# Patient Record
Sex: Male | Born: 2000 | Race: White | Hispanic: No | Marital: Single | Smoking: Never smoker
Health system: Southern US, Community
[De-identification: ages and names within clinical notes are randomized; demographics above are authoritative.]

## PROBLEM LIST (undated history)

## (undated) DIAGNOSIS — F913 Oppositional defiant disorder: Secondary | ICD-10-CM

## (undated) DIAGNOSIS — R010 Benign and innocent cardiac murmurs: Secondary | ICD-10-CM

## (undated) DIAGNOSIS — Z973 Presence of spectacles and contact lenses: Secondary | ICD-10-CM

## (undated) DIAGNOSIS — F39 Unspecified mood [affective] disorder: Secondary | ICD-10-CM

## (undated) DIAGNOSIS — J302 Other seasonal allergic rhinitis: Secondary | ICD-10-CM

## (undated) DIAGNOSIS — F909 Attention-deficit hyperactivity disorder, unspecified type: Secondary | ICD-10-CM

## (undated) DIAGNOSIS — G4700 Insomnia, unspecified: Secondary | ICD-10-CM

## (undated) HISTORY — DX: Other seasonal allergic rhinitis: J30.2

## (undated) HISTORY — DX: Oppositional defiant disorder: F91.3

## (undated) HISTORY — DX: Insomnia, unspecified: G47.00

## (undated) HISTORY — DX: Attention-deficit hyperactivity disorder, unspecified type: F90.9

## (undated) HISTORY — DX: Unspecified mood (affective) disorder: F39

## (undated) HISTORY — DX: Presence of spectacles and contact lenses: Z97.3

## (undated) HISTORY — PX: OTHER SURGICAL HISTORY: SHX169

---
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
## 2020-04-01 ENCOUNTER — Encounter (HOSPITAL_COMMUNITY): Payer: Self-pay | Admitting: Emergency Medicine

## 2020-04-01 ENCOUNTER — Other Ambulatory Visit: Payer: Self-pay

## 2020-04-01 ENCOUNTER — Emergency Department (HOSPITAL_COMMUNITY): Payer: BC Managed Care – PPO

## 2020-04-01 DIAGNOSIS — W298XXA Contact with other powered powered hand tools and household machinery, initial encounter: Secondary | ICD-10-CM | POA: Diagnosis not present

## 2020-04-01 DIAGNOSIS — Z79899 Other long term (current) drug therapy: Secondary | ICD-10-CM | POA: Diagnosis not present

## 2020-04-01 DIAGNOSIS — S60222A Contusion of left hand, initial encounter: Secondary | ICD-10-CM | POA: Insufficient documentation

## 2020-04-01 DIAGNOSIS — J45909 Unspecified asthma, uncomplicated: Secondary | ICD-10-CM | POA: Insufficient documentation

## 2020-04-01 DIAGNOSIS — S6992XA Unspecified injury of left wrist, hand and finger(s), initial encounter: Secondary | ICD-10-CM | POA: Diagnosis present

## 2020-04-01 DIAGNOSIS — Y92513 Shop (commercial) as the place of occurrence of the external cause: Secondary | ICD-10-CM | POA: Diagnosis not present

## 2020-04-01 DIAGNOSIS — Y9389 Activity, other specified: Secondary | ICD-10-CM | POA: Diagnosis not present

## 2020-04-01 DIAGNOSIS — Y999 Unspecified external cause status: Secondary | ICD-10-CM | POA: Diagnosis not present

## 2020-04-01 DIAGNOSIS — Z7951 Long term (current) use of inhaled steroids: Secondary | ICD-10-CM | POA: Diagnosis not present

## 2020-04-01 NOTE — ED Triage Notes (Signed)
Pt c/o left hand swelling since Thursday when he hit it at work. He seen a doctor and was put on prednisone. Pt states the hand is not getting any better.

## 2020-04-02 ENCOUNTER — Emergency Department (HOSPITAL_COMMUNITY)
Admission: EM | Admit: 2020-04-02 | Discharge: 2020-04-02 | Disposition: A | Discharge status: Home / Self Care | Payer: BC Managed Care – PPO | Attending: Emergency Medicine | Admitting: Emergency Medicine

## 2020-04-02 DIAGNOSIS — S60222A Contusion of left hand, initial encounter: Secondary | ICD-10-CM

## 2020-04-02 LAB — CBC WITH DIFFERENTIAL/PLATELET
Abs Immature Granulocytes: 0.04 10*3/uL (ref 0.00–0.07)
Basophils Absolute: 0 10*3/uL (ref 0.0–0.1)
Basophils Relative: 0 %
Eosinophils Absolute: 0 10*3/uL (ref 0.0–0.5)
Eosinophils Relative: 0 %
HCT: 50.4 % (ref 39.0–52.0)
Hemoglobin: 16.9 g/dL (ref 13.0–17.0)
Immature Granulocytes: 0 %
Lymphocytes Relative: 11 %
Lymphs Abs: 1.1 10*3/uL (ref 0.7–4.0)
MCH: 30.2 pg (ref 26.0–34.0)
MCHC: 33.5 g/dL (ref 30.0–36.0)
MCV: 90 fL (ref 80.0–100.0)
Monocytes Absolute: 0.7 10*3/uL (ref 0.1–1.0)
Monocytes Relative: 7 %
Neutro Abs: 8.2 10*3/uL — ABNORMAL HIGH (ref 1.7–7.7)
Neutrophils Relative %: 82 %
Platelets: 314 10*3/uL (ref 150–400)
RBC: 5.6 MIL/uL (ref 4.22–5.81)
RDW: 12.1 % (ref 11.5–15.5)
WBC: 10.1 10*3/uL (ref 4.0–10.5)
nRBC: 0 % (ref 0.0–0.2)

## 2020-04-02 LAB — BASIC METABOLIC PANEL
Anion gap: 10 (ref 5–15)
BUN: 11 mg/dL (ref 6–20)
CO2: 29 mmol/L (ref 22–32)
Calcium: 9.6 mg/dL (ref 8.9–10.3)
Chloride: 98 mmol/L (ref 98–111)
Creatinine, Ser: 0.67 mg/dL (ref 0.61–1.24)
GFR calc Af Amer: >60 mL/min (ref >60)
GFR calc non Af Amer: >60 mL/min (ref >60)
Glucose, Bld: 106 mg/dL — ABNORMAL HIGH (ref 70–99)
Potassium: 4.5 mmol/L (ref 3.5–5.1)
Sodium: 137 mmol/L (ref 135–145)

## 2020-04-02 NOTE — ED Provider Notes (Signed)
[facility] Provider Note   CSN: [id number] Arrival date & time: 04/01/20  1732   Time seen 02:16 AM  History Chief Complaint  Patient presents with  . Wound Check    [facility] is a 19 y.o. male.  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  He is left-handed.  He hit the dorsum of his left hand.  He states initially the swelling was around the MCP joint of his index and middle finger and now has spread towards his little finger.  The injury happened on September 2 and he was seen at [facility] and they thought there was a small blister on the dorsum of his hand and that he had a spider bite with a reaction.  He was given a steroid injection and a steroid Dosepak that he will finish tomorrow.  He states the swelling is better however he still has some bruising present.  He denies any numbness of his fingers or fever.  He is just concerned that he may have had an underlying fracture.  PCP [name], MD   Past Medical History:  Diagnosis Date  . ADHD (attention deficit hyperactivity disorder)   . Asthma   . Insomnia 09/04/2003  . Oppositional defiant disorder   . Seasonal allergies   . Unspecified episodic mood disorder   . Wears glasses     Patient Active Problem List   Diagnosis Date Noted  . 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 OCD (obsessive compulsive disorder) 06/03/2012  . Chronic motor tic 06/03/2012  . Unspecified episodic mood disorder 07/08/2011  . ADHD (attention deficit hyperactivity disorder), combined type 07/08/2011  . ODD (oppositional defiant disorder) 07/08/2011    Past Surgical History:  Procedure Laterality Date  . CIRCUMCISION  03-19-2000  . tubes in ears     in the past       Family History  Problem Relation Age of Onset  . Bipolar disorder  Mother   . Migraines Mother   . Anxiety disorder Mother   . ADD / ADHD Brother   . Seizures Brother   . Migraines Brother   . Anxiety disorder Brother   . Asthma Brother   . ADD / ADHD Brother   . OCD Brother   . Migraines Brother   . Insomnia Brother   . ADD / ADHD Sister   . Alcohol abuse Father   . Anxiety disorder Maternal Grandfather   . Alcohol abuse Paternal Grandfather   . Anxiety disorder Paternal Grandmother        PGGM  . Dementia Neg Hx   . Depression Neg Hx   . Drug abuse Neg Hx   . Schizophrenia Neg Hx   . Paranoid behavior Neg Hx   . Sexual abuse Neg Hx   . Physical abuse Neg Hx     Social History   Tobacco Use  . Smoking status: Never Smoker  . Smokeless tobacco: Never Used  Substance Use Topics  . Alcohol use: No  . Drug use: No  employed  Home Medications Prior to Admission medications   Medication Sig Start Date End Date Taking? Authorizing Provider  ARIPiprazole (ABILIFY) 10 MG tablet Take 1 tablet (10 mg total) by mouth daily. 05/30/18  [name], MD  atomoxetine (STRATTERA) 40 MG capsule Take 1 capsule (40 mg total) by mouth daily. 05/30/18   [name], MD  beclomethasone (QVAR) 80 MCG/ACT inhaler Inhale 2 puffs into the lungs 2 (two) times daily.    [provider]  cloNIDine (CATAPRES) 0.1 MG tablet Take 1 tablet (0.1 mg total) by mouth at bedtime. 05/30/18   [name], MD  Melatonin 3 MG TABS Take by mouth. Taking 2 Tablets QHS    [provider]  mometasone (ASMANEX) 220 MCG/INH inhaler Inhale 2 puffs into the lungs daily.    [provider]  Nutritional Supplements (PEDIASURE 1.5 CAL) LIQD Drink one can twice a day 08/24/17   [name], MD  Pediatric Multi Vit-Extra C-FA ([employer] CHILDRENS MULTIVIT/EXTRA C) CHEW Chew 1 tablet by mouth daily.    [provider]  predniSONE (DELTASONE) 10 MG tablet Take 2 tablets (20 mg total) by mouth 2 (two) times daily. 08/02/18   [name], MD  PROAIR  HFA 108 (90 BASE) MCG/ACT inhaler Inhale 2 puffs into the lungs every 4 (four) hours as needed. For coughing 02/11/12   [provider]    Allergies    Amoxicillin  Review of Systems   Review of Systems  All other systems reviewed and are negative.   Physical Exam Updated Vital Signs BP 135/72   Pulse 87   Temp 98.6 F (37 C) (Oral)   Resp 18   Ht 5\' 7"  (1.702 m)   Wt 54 kg   SpO2 99%   BMI 18.64 kg/m   Physical Exam Vitals and nursing note reviewed.  Constitutional:      General: He is not in acute distress.    Appearance: Normal appearance. He is normal weight.  HENT:     Head: Normocephalic and atraumatic.     Right Ear: External ear normal.     Left Ear: External ear normal.  Eyes:     Extraocular Movements: Extraocular movements intact.     Conjunctiva/sclera: Conjunctivae normal.  Cardiovascular:     Rate and Rhythm: Normal rate.  Pulmonary:     Effort: Pulmonary effort is normal. No respiratory distress.  Musculoskeletal:        General: Swelling present.     Cervical back: Normal range of motion.     Comments: Patient is noted to have some mild diffuse swelling of the dorsum of his left hand.  There is no localizing tenderness to palpation.  When I examined his left upper extremity there is no warmth to the skin although it does look bruised.  He has good range of motion of his fingers.  He has normal capillary refill.  He has good distal pulses.  Skin:    General: Skin is warm and dry.     Findings: Bruising present.  Neurological:     General: No focal deficit present.     Mental Status: He is alert and oriented to person, place, and time.     Cranial Nerves: No cranial nerve deficit.  Psychiatric:        Mood and Affect: Mood normal.        Behavior: Behavior normal.        Thought Content: Thought content normal.     ED Results / Procedures / Treatments   Labs (all labs ordered are listed, but only abnormal results are  displayed) Results for orders placed or performed during the [facility] encounter of 04/02/20  CBC with Differential  Result Value Ref Range   WBC 10.1 4.0 - 10.5 K/uL   RBC 5.60 4.22 - 5.81 MIL/uL   Hemoglobin 16.9 13.0 - 17.0 g/dL   HCT 03.1 39 - 52 %   MCV 90.0 80.0 - 100.0 fL   MCH 30.2 26.0 - 34.0 pg   MCHC 33.5 30.0 - 36.0 g/dL   RDW 59.4 58.5 - 92.9 %   Platelets 314 150 - 400 K/uL   nRBC 0.0 0.0 - 0.2 %   Neutrophils Relative % 82 %   Neutro Abs 8.2 (H) 1.7 - 7.7 K/uL   Lymphocytes Relative 11 %   Lymphs Abs 1.1 0.7 - 4.0 K/uL   Monocytes Relative 7 %   Monocytes Absolute 0.7 0 - 1 K/uL   Eosinophils Relative 0 %   Eosinophils Absolute 0.0 0 - 0 K/uL   Basophils Relative 0 %   Basophils Absolute 0.0 0 - 0 K/uL   Immature Granulocytes 0 %   Abs Immature Granulocytes 0.04 0.00 - 0.07 K/uL  Basic metabolic panel  Result Value Ref Range   Sodium 137 135 - 145 mmol/L   Potassium 4.5 3.5 - 5.1 mmol/L   Chloride 98 98 - 111 mmol/L   CO2 29 22 - 32 mmol/L   Glucose, Bld 106 (H) 70 - 99 mg/dL   BUN 11 6 - 20 mg/dL   Creatinine, Ser 2.44 0.61 - 1.24 mg/dL   Calcium 9.6 8.9 - 62.8 mg/dL   GFR calc non Af Amer >60 >60 mL/min   GFR calc Af Amer >60 >60 mL/min   Anion gap 10 5 - 15   Laboratory interpretation all normal     EKG None  Radiology DG Hand Complete Left  Result Date: 04/01/2020 CLINICAL DATA:  Pain and swelling EXAM: LEFT HAND - COMPLETE 3+ VIEW COMPARISON:  None. FINDINGS: And three view radiograph left hand demonstrates normal alignment. No acute fracture or dislocation. Joint spaces are preserved. There is moderate soft tissue swelling along the dorsum of the left hand. IMPRESSION: Soft tissue swelling without acute fracture or dislocation. Electronically Signed   By: Helyn Numbers MD   On: 04/01/2020 19:59    Procedures Procedures (including critical care time)  Medications Ordered in ED Medications - No data to display  ED Course  I have reviewed  the triage vital signs and the nursing notes.  Pertinent labs & imaging results that were available during my care of the patient were reviewed by me and considered in my medical decision making (see chart for details).    MDM Rules/Calculators/A&P                          Patient presents after injury to the dorsum of his left hand about 5 days ago with persistent bruising and swelling.  His x-ray does not show fracture.  When I examined his hand it is not hot to touch or erythematous, there is bruising seen.  He has good range of motion of his fingers.  His white blood cell count is normal and he is afebrile.  We discussed this is just bruising it can take another week to 10 days to resolve.  However if he gets numbness or pain in his fingers he should be reevaluated.    Final Clinical Impression(s) / ED Diagnoses Final diagnoses:  Contusion of left hand, initial encounter    Rx / DC Orders ED Discharge Orders  None    OTC ibuprofen and acetaminophen  Plan discharge   Devoria AlbeIva Dvaughn Fickle, MD, Concha PyoFACEP    Kailan Carmen, MD 04/02/20 [phone]

## 2020-04-02 NOTE — Discharge Instructions (Signed)
Elevate your hand, use ice packs for swelling and bruising. At the end of the week you can start using heat. Take ibuprofen 400 mg + acetaminophen 500 mg every 6 hrs for pain. If you get pain in your fingers or numbness, you should be rechecked.

## 2020-08-07 ENCOUNTER — Emergency Department (HOSPITAL_COMMUNITY)
Admission: EM | Admit: 2020-08-07 | Discharge: 2020-08-07 | Disposition: A | Discharge status: Home / Self Care | Payer: BC Managed Care – PPO | Attending: Emergency Medicine | Admitting: Emergency Medicine

## 2020-08-07 ENCOUNTER — Emergency Department (HOSPITAL_COMMUNITY): Payer: BC Managed Care – PPO

## 2020-08-07 ENCOUNTER — Encounter (HOSPITAL_COMMUNITY): Payer: Self-pay

## 2020-08-07 ENCOUNTER — Other Ambulatory Visit: Payer: Self-pay

## 2020-08-07 DIAGNOSIS — J45909 Unspecified asthma, uncomplicated: Secondary | ICD-10-CM | POA: Insufficient documentation

## 2020-08-07 DIAGNOSIS — R002 Palpitations: Secondary | ICD-10-CM | POA: Insufficient documentation

## 2020-08-07 DIAGNOSIS — R000 Tachycardia, unspecified: Secondary | ICD-10-CM | POA: Insufficient documentation

## 2020-08-07 DIAGNOSIS — Z7951 Long term (current) use of inhaled steroids: Secondary | ICD-10-CM | POA: Insufficient documentation

## 2020-08-07 HISTORY — DX: Benign and innocent cardiac murmurs: R01.0

## 2020-08-07 LAB — CBC
HCT: 47.8 % (ref 39.0–52.0)
Hemoglobin: 16 g/dL (ref 13.0–17.0)
MCH: 30 pg (ref 26.0–34.0)
MCHC: 33.5 g/dL (ref 30.0–36.0)
MCV: 89.5 fL (ref 80.0–100.0)
Platelets: 303 10*3/uL (ref 150–400)
RBC: 5.34 MIL/uL (ref 4.22–5.81)
RDW: 12.1 % (ref 11.5–15.5)
WBC: 7.7 10*3/uL (ref 4.0–10.5)
nRBC: 0 % (ref 0.0–0.2)

## 2020-08-07 LAB — BASIC METABOLIC PANEL
Anion gap: 9 (ref 5–15)
BUN: 13 mg/dL (ref 6–20)
CO2: 29 mmol/L (ref 22–32)
Calcium: 9.4 mg/dL (ref 8.9–10.3)
Chloride: 100 mmol/L (ref 98–111)
Creatinine, Ser: 0.79 mg/dL (ref 0.61–1.24)
GFR, Estimated: >60 mL/min (ref >60)
Glucose, Bld: 91 mg/dL (ref 70–99)
Potassium: 4.1 mmol/L (ref 3.5–5.1)
Sodium: 138 mmol/L (ref 135–145)

## 2020-08-07 NOTE — ED Triage Notes (Signed)
Pt presents to ED with high heart rate. 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 Pt states he was evaluated by the nurse at work at the time and had a burning feeling in his chest. Pt denies any pain at this. Nurses note states HR ranges from 146-154, after vagal maneuvers HR decreased to 76.

## 2020-08-07 NOTE — ED Notes (Signed)
Pt. Ambulated to with steady gait to room.

## 2020-08-07 NOTE — ED Provider Notes (Signed)
[facility] EMERGENCY DEPARTMENT Provider Note   CSN: [id number] Arrival date & time: 08/07/20  1713     History Chief Complaint  Patient presents with  . Tachycardia    Cole Ashley is a 20 y.o. male.  HPI   This patient is a 20 year old male, he has a history of bipolar disorder as well as oppositional defiant disorder and is currently taking medications including Strattera, Abilify, clonidine, Lamictal as well as another medicine for ADHD.  The patient reports that he was in his usual state of health until he started feeling like his heart was racing earlier in the day.  This occurred while he was at work doing his normal daily activities.  He has been at that job for approximately 18 months and has no stress or anxiety related to the job.  In fact recently the patient has been doing very well getting between 8 and 10 hours of sleep, he does not feel overstimulated, he does not feel agitated or manic at all.  The family members accompanying the patient and by phone states that the patient has been in his usual state of health and doing very well.  He has a follow-up planned with his psychiatrist for the 20th of this month, he is actively being reduced on his Strattera as he goes on to one of the other medications.  The patient denies any alcohol or tobacco use, he does not take any stimulants other than the medications he is prescribed.  He does drink a good amount of caffeine in a day including coffee tea and sodas.  At this time the patient does not have any symptoms, he does not have any chest pain, he recorded his heart rate as high as 153 earlier in the day, at this time it is around 100.  There has been no dysuria diarrhea rashes swelling chest pain coughing or shortness of breath, no headache or COVID exposures.  Past Medical History:  Diagnosis Date  . ADHD (attention deficit hyperactivity disorder)   . Asthma   . Innocent heart murmur   . Insomnia 09/04/2003  . Oppositional  defiant disorder   . Seasonal allergies   . Unspecified episodic mood disorder   . Wears glasses     Patient Active Problem List   Diagnosis Date Noted  . 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 OCD (obsessive compulsive disorder) 06/03/2012  . Chronic motor tic 06/03/2012  . Unspecified episodic mood disorder 07/08/2011  . ADHD (attention deficit hyperactivity disorder), combined type 07/08/2011  . ODD (oppositional defiant disorder) 07/08/2011    Past Surgical History:  Procedure Laterality Date  . CIRCUMCISION  30-Oct-1999  . tubes in ears     in the past       Family History  Problem Relation Age of Onset  . Bipolar disorder Mother   . Migraines Mother   . Anxiety disorder Mother   . ADD / ADHD Brother   . Seizures Brother   . Migraines Brother   . Anxiety disorder Brother   . Asthma Brother   . ADD / ADHD Brother   . OCD Brother   . Migraines Brother   . Insomnia Brother   . ADD / ADHD Sister   . Alcohol abuse Father   . Anxiety disorder Maternal Grandfather   . Alcohol abuse Paternal Grandfather   . Anxiety disorder Paternal Grandmother        PGGM  . Dementia Neg Hx   . Depression Neg  Hx   . Drug abuse Neg Hx   . Schizophrenia Neg Hx   . Paranoid behavior Neg Hx   . Sexual abuse Neg Hx   . Physical abuse Neg Hx     Social History   Tobacco Use  . Smoking status: Never Smoker  . Smokeless tobacco: Never Used  Substance Use Topics  . Alcohol use: No  . Drug use: No    Home Medications Prior to Admission medications   Medication Sig Start Date End Date Taking? Authorizing Provider  ARIPiprazole (ABILIFY) 10 MG tablet Take 1 tablet (10 mg total) by mouth daily. 05/30/18   [name], MD  atomoxetine (STRATTERA) 40 MG capsule Take 1 capsule (40 mg total) by mouth daily. 05/30/18   [name], MD  beclomethasone (QVAR) 80 MCG/ACT inhaler Inhale 2 puffs into the lungs 2 (two) times daily.    [provider]  cloNIDine  (CATAPRES) 0.1 MG tablet Take 1 tablet (0.1 mg total) by mouth at bedtime. 05/30/18   [name], MD  Melatonin 3 MG TABS Take by mouth. Taking 2 Tablets QHS    [provider]  mometasone (ASMANEX) 220 MCG/INH inhaler Inhale 2 puffs into the lungs daily.    [provider]  Nutritional Supplements (PEDIASURE 1.5 CAL) LIQD Drink one can twice a day 08/24/17   [name], MD  Pediatric Multi Vit-Extra C-FA ([employer] CHILDRENS MULTIVIT/EXTRA C) CHEW Chew 1 tablet by mouth daily.    [provider]  predniSONE (DELTASONE) 10 MG tablet Take 2 tablets (20 mg total) by mouth 2 (two) times daily. 08/02/18   [name], MD  PROAIR HFA 108 (90 BASE) MCG/ACT inhaler Inhale 2 puffs into the lungs every 4 (four) hours as needed. For coughing 02/11/12   [provider]    Allergies    Amoxicillin  Review of Systems   Review of Systems  All other systems reviewed and are negative.   Physical Exam Updated Vital Signs BP 121/80   Pulse 85   Temp 99.1 F (37.3 C) (Oral)   Resp 15   Ht 1.702 m (5\' 7" )   Wt 56.7 kg   SpO2 100%   BMI 19.58 kg/m   Physical Exam Vitals and nursing note reviewed.  Constitutional:      General: He is not in acute distress.    Appearance: He is well-developed and well-nourished.  HENT:     Head: Normocephalic and atraumatic.     Mouth/Throat:     Mouth: Oropharynx is clear and moist.     Pharynx: No oropharyngeal exudate.  Eyes:     General: No scleral icterus.       Right eye: No discharge.        Left eye: No discharge.     Extraocular Movements: EOM normal.     Conjunctiva/sclera: Conjunctivae normal.     Pupils: Pupils are equal, round, and reactive to light.  Neck:     Thyroid: No thyromegaly.     Vascular: No JVD.  Cardiovascular:     Rate and Rhythm: Normal rate and regular rhythm.     Pulses: Intact distal pulses.     Heart sounds: Normal heart sounds. No murmur heard. No friction rub. No gallop.    Pulmonary:     Effort: Pulmonary effort is normal. No respiratory distress.     Breath sounds: Normal breath sounds. No wheezing or rales.  Abdominal:     General: Bowel sounds  are normal. There is no distension.     Palpations: Abdomen is soft. There is no mass.     Tenderness: There is no abdominal tenderness.  Musculoskeletal:        General: No tenderness or edema. Normal range of motion.     Cervical back: Normal range of motion and neck supple.  Lymphadenopathy:     Cervical: No cervical adenopathy.  Skin:    General: Skin is warm and dry.     Findings: No erythema or rash.  Neurological:     General: No focal deficit present.     Mental Status: He is alert.     Coordination: Coordination normal.  Psychiatric:        Mood and Affect: Mood and affect normal.        Behavior: Behavior normal.     ED Results / Procedures / Treatments   Labs (all labs ordered are listed, but only abnormal results are displayed) Labs Reviewed  BASIC METABOLIC PANEL  CBC  RAPID URINE DRUG SCREEN, HOSP PERFORMED  TSH  T4  T3    EKG EKG Interpretation  Date/Time:  Wednesday August 07 2020 17:30:35 EST Ventricular Rate:  127 PR Interval:  128 QRS Duration: 94 QT Interval:  290 QTC Calculation: 421 R Axis:   85 Text Interpretation: Sinus tachycardia ST & T wave abnormality, consider inferior ischemia Abnormal ECG No old tracing to compare Confirmed by Eber HongMiller, Casha Estupinan ([id number]) on 08/07/2020 5:41:41 PM   Radiology DG Chest 2 View  Result Date: 08/07/2020 CLINICAL DATA:  Chest pain EXAM: CHEST - 2 VIEW COMPARISON:  06/30/2007 FINDINGS: The heart size and mediastinal contours are within normal limits. Both lungs are clear. The visualized skeletal structures are unremarkable. IMPRESSION: No active cardiopulmonary disease. Electronically Signed   By: Jasmine PangKim  Fujinaga M.D.   On: 08/07/2020 17:51    Procedures Procedures (including critical care time)  Medications Ordered in ED Medications  - No data to display  ED Course  I have reviewed the triage vital signs and the nursing notes.  Pertinent labs & imaging results that were available during my care of the patient were reviewed by me and considered in my medical decision making (see chart for details).    MDM Rules/Calculators/A&P                          The patient's teeth are in general poor repair but no signs of abscess.  He has no murmurs, he has a mild tachycardia which ranges between 90 and 110.  He does not appear anxious, he does not appear manic, he does not appear unstable whatsoever.  The patient has no signs of pulmonary embolism including edema of his legs, he does not have any chest pain, his labs are totally unremarkable including a metabolic panel, CBC as well as a chest x-ray and an EKG which only showed a sinus tachycardia with some nonspecific T wave abnormalities.  The patient will be tested for thyroid function, he will need to talk with his psychiatrist about removing some of the stimulant medications and can follow-up in the outpatient setting.  At this time he does not appear unstable whatsoever with a heart rate as low as 85 and a blood pressure of 121/80.  I have encouraged him to reduce and in fact eliminate his caffeine intake and he is agreeable.  Family members at the bedside are very supportive, they will follow-up with PCP as needed  Final Clinical Impression(s) / ED Diagnoses Final diagnoses:  Palpitations  Sinus tachycardia      Eber Hong, MD 08/07/20 2301

## 2020-08-07 NOTE — Discharge Instructions (Signed)
Please see your family doctor this week, call in the morning to make the next available appointment to discuss your thyroid results, you will also need to speak with your psychiatrist about medication management for the stimulant medications you are taking  Please stop drinking caffeine altogether  Emergency department for severe or worsening symptoms

## 2020-08-08 LAB — TSH: TSH: 0.879 u[IU]/mL (ref 0.350–4.500)

## 2020-08-09 LAB — T3: T3, Total: 156 ng/dL (ref 71–180)

## 2020-08-09 LAB — T4: T4, Total: 5.7 ug/dL (ref 4.5–12.0)

## 2020-11-15 ENCOUNTER — Encounter (HOSPITAL_COMMUNITY): Payer: Self-pay

## 2020-11-15 ENCOUNTER — Other Ambulatory Visit: Payer: Self-pay

## 2020-11-15 ENCOUNTER — Emergency Department (HOSPITAL_COMMUNITY)
Admission: EM | Admit: 2020-11-15 | Discharge: 2020-11-17 | Disposition: A | Discharge status: Other Acute Inpatient Hospital | Payer: Medicaid Other | Attending: Emergency Medicine | Admitting: Emergency Medicine

## 2020-11-15 DIAGNOSIS — Z20822 Contact with and (suspected) exposure to covid-19: Secondary | ICD-10-CM | POA: Insufficient documentation

## 2020-11-15 DIAGNOSIS — Z79899 Other long term (current) drug therapy: Secondary | ICD-10-CM | POA: Diagnosis not present

## 2020-11-15 DIAGNOSIS — F332 Major depressive disorder, recurrent severe without psychotic features: Secondary | ICD-10-CM | POA: Diagnosis not present

## 2020-11-15 DIAGNOSIS — J45909 Unspecified asthma, uncomplicated: Secondary | ICD-10-CM | POA: Diagnosis not present

## 2020-11-15 DIAGNOSIS — R45851 Suicidal ideations: Secondary | ICD-10-CM | POA: Insufficient documentation

## 2020-11-15 DIAGNOSIS — F32A Depression, unspecified: Secondary | ICD-10-CM | POA: Diagnosis present

## 2020-11-15 DIAGNOSIS — Z7951 Long term (current) use of inhaled steroids: Secondary | ICD-10-CM | POA: Insufficient documentation

## 2020-11-15 LAB — SALICYLATE LEVEL: Salicylate Lvl: <7 mg/dL — ABNORMAL LOW (ref 7.0–30.0)

## 2020-11-15 LAB — CBC WITH DIFFERENTIAL/PLATELET
Abs Immature Granulocytes: 0.01 10*3/uL (ref 0.00–0.07)
Basophils Absolute: 0.1 10*3/uL (ref 0.0–0.1)
Basophils Relative: 1 %
Eosinophils Absolute: 0 10*3/uL (ref 0.0–0.5)
Eosinophils Relative: 0 %
HCT: 47.1 % (ref 39.0–52.0)
Hemoglobin: 15.4 g/dL (ref 13.0–17.0)
Immature Granulocytes: 0 %
Lymphocytes Relative: 21 %
Lymphs Abs: 2.1 10*3/uL (ref 0.7–4.0)
MCH: 29.7 pg (ref 26.0–34.0)
MCHC: 32.7 g/dL (ref 30.0–36.0)
MCV: 90.9 fL (ref 80.0–100.0)
Monocytes Absolute: 0.8 10*3/uL (ref 0.1–1.0)
Monocytes Relative: 8 %
Neutro Abs: 7.1 10*3/uL (ref 1.7–7.7)
Neutrophils Relative %: 70 %
Platelets: 271 10*3/uL (ref 150–400)
RBC: 5.18 MIL/uL (ref 4.22–5.81)
RDW: 12.8 % (ref 11.5–15.5)
WBC: 10.1 10*3/uL (ref 4.0–10.5)
nRBC: 0 % (ref 0.0–0.2)

## 2020-11-15 LAB — COMPREHENSIVE METABOLIC PANEL
ALT: 14 U/L (ref 0–44)
AST: 17 U/L (ref 15–41)
Albumin: 5 g/dL (ref 3.5–5.0)
Alkaline Phosphatase: 72 U/L (ref 38–126)
Anion gap: 10 (ref 5–15)
BUN: 12 mg/dL (ref 6–20)
CO2: 27 mmol/L (ref 22–32)
Calcium: 9.8 mg/dL (ref 8.9–10.3)
Chloride: 102 mmol/L (ref 98–111)
Creatinine, Ser: 0.81 mg/dL (ref 0.61–1.24)
GFR, Estimated: >60 mL/min (ref >60)
Glucose, Bld: 87 mg/dL (ref 70–99)
Potassium: 3.9 mmol/L (ref 3.5–5.1)
Sodium: 139 mmol/L (ref 135–145)
Total Bilirubin: 2.2 mg/dL — ABNORMAL HIGH (ref 0.3–1.2)
Total Protein: 7.4 g/dL (ref 6.5–8.1)

## 2020-11-15 LAB — RAPID URINE DRUG SCREEN, HOSP PERFORMED
Amphetamines: NOT DETECTED
Barbiturates: NOT DETECTED
Benzodiazepines: NOT DETECTED
Cocaine: NOT DETECTED
Opiates: NOT DETECTED
Tetrahydrocannabinol: NOT DETECTED

## 2020-11-15 LAB — ETHANOL: Alcohol, Ethyl (B): <10 mg/dL (ref <10)

## 2020-11-15 MED ORDER — ATOMOXETINE HCL 40 MG PO CAPS
40.0000 mg | ORAL_CAPSULE | Freq: Every day | ORAL | Status: DC
  Administered 2020-11-16: 40 mg via ORAL
  Filled 2020-11-15 (×3): qty 1

## 2020-11-15 MED ORDER — FLUTICASONE PROPIONATE HFA 44 MCG/ACT IN AERO
2.0000 | INHALATION_SPRAY | Freq: Two times a day (BID) | RESPIRATORY_TRACT | Status: DC
  Filled 2020-11-15: qty 10.6

## 2020-11-15 MED ORDER — LAMOTRIGINE 25 MG PO TABS: 100.0000 mg | ORAL_TABLET | Freq: Every morning | ORAL | Status: DC

## 2020-11-15 MED ORDER — LAMOTRIGINE 25 MG PO TABS
100.0000 mg | ORAL_TABLET | Freq: Every morning | ORAL | Status: DC
  Administered 2020-11-16: 100 mg via ORAL
  Filled 2020-11-15: qty 4

## 2020-11-15 MED ORDER — MOMETASONE FUROATE 220 MCG/INH IN AEPB: 2.0000 | INHALATION_SPRAY | Freq: Every day | RESPIRATORY_TRACT | Status: DC

## 2020-11-15 MED ORDER — ALBUTEROL SULFATE HFA 108 (90 BASE) MCG/ACT IN AERS
2.0000 | INHALATION_SPRAY | RESPIRATORY_TRACT | Status: DC | PRN
  Filled 2020-11-15: qty 6.7

## 2020-11-15 NOTE — ED Triage Notes (Signed)
Pt arrived via POV c/o mental health problems. Pt reports recent arguments with parents and disputes with girlfriend. Pt arrives voluntary with plans of SI. Pt reports planning to OD on medications, run in traffic and drown self in river. Pt reports almost running in front of a vehicle on the highway while speaking with staff from the Suicide Hotline on his phone today.

## 2020-11-15 NOTE — ED Notes (Signed)
Pt changed and belongings placed in locker at this time. Security present to assure pt is clear of all hazards. Room is secure as well. Pt is calm and cooperative at this time.

## 2020-11-15 NOTE — ED Provider Notes (Signed)
[facility] EMERGENCY DEPARTMENT Provider Note   CSN: [id number] Arrival date & time: 11/15/20  1929     History Chief Complaint  Patient presents with  . Medical Clearance    Cole Ashley is a 20 y.o. male.  Patient presents complaining of suicidal thoughts and depression over the past 2 days.  States he is having relationship problems with his girlfriend.  He has been feeling very depressed and does not want to go on with life.  States yesterday he thought about drowning himself in a pool or overdosing medications.  Today he thought about running into traffic.  He also wanted his girlfriend's family member to shoot him with a gun.  States he is not going through any of these plans continues to have the thoughts.  He does not take any medications for depression or anxiety is never been treated for this.  He denies any alcohol or drug use.  He denies any thoughts of wanting to harm anyone else or hearing any voices.  Denies any physical injury.  Denies any head, neck, back, chest or abdominal pain.  No nausea or vomiting or fever.  Multiple medications listed in his chart but states he is no longer on any of these.  States he only takes Lamictal and Concerta.  The history is provided by the patient.       Past Medical History:  Diagnosis Date  . ADHD (attention deficit hyperactivity disorder)   . Asthma   . Innocent heart murmur   . Insomnia 09/04/2003  . Oppositional defiant disorder   . Seasonal allergies   . Unspecified episodic mood disorder   . Wears glasses     Patient Active Problem List   Diagnosis Date Noted  . 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 OCD (obsessive compulsive disorder) 06/03/2012  . Chronic motor tic 06/03/2012  . Unspecified episodic mood disorder 07/08/2011  . ADHD (attention deficit hyperactivity disorder), combined type 07/08/2011  . ODD (oppositional defiant disorder) 07/08/2011    Past Surgical History:  Procedure Laterality Date  .  CIRCUMCISION  August 19, 1999  . tubes in ears     in the past       Family History  Problem Relation Age of Onset  . Bipolar disorder Mother   . Migraines Mother   . Anxiety disorder Mother   . ADD / ADHD Brother   . Seizures Brother   . Migraines Brother   . Anxiety disorder Brother   . Asthma Brother   . ADD / ADHD Brother   . OCD Brother   . Migraines Brother   . Insomnia Brother   . ADD / ADHD Sister   . Alcohol abuse Father   . Anxiety disorder Maternal Grandfather   . Alcohol abuse Paternal Grandfather   . Anxiety disorder Paternal Grandmother        PGGM  . Dementia Neg Hx   . Depression Neg Hx   . Drug abuse Neg Hx   . Schizophrenia Neg Hx   . Paranoid behavior Neg Hx   . Sexual abuse Neg Hx   . Physical abuse Neg Hx     Social History   Tobacco Use  . Smoking status: Never Smoker  . Smokeless tobacco: Never Used  Vaping Use  . Vaping Use: Never used  Substance Use Topics  . Alcohol use: No  . Drug use: No    Home Medications Prior to Admission medications   Medication Sig Start Date End Date  Taking? Authorizing Provider  beclomethasone (QVAR) 80 MCG/ACT inhaler Inhale 2 puffs into the lungs 2 (two) times daily.   Yes [provider]  cloNIDine (CATAPRES) 0.1 MG tablet Take 1 tablet (0.1 mg total) by mouth at bedtime. 05/30/18  Yes [name], MD  lamoTRIgine (LAMICTAL) 25 MG tablet Take 100 mg by mouth AC breakfast.   Yes [provider]  mometasone (ASMANEX) 220 MCG/INH inhaler Inhale 2 puffs into the lungs daily.   Yes [provider]  Multiple Vitamin (MULTI-VITAMIN) tablet Take 1 tablet by mouth daily.   Yes [provider]  PROAIR HFA 108 (90 BASE) MCG/ACT inhaler Inhale 2 puffs into the lungs every 4 (four) hours as needed. For coughing 02/11/12  Yes [provider]  ARIPiprazole (ABILIFY) 10 MG tablet Take 1 tablet (10 mg total) by mouth daily. 05/30/18   [name], MD  atomoxetine (STRATTERA) 40  MG capsule Take 1 capsule (40 mg total) by mouth daily. 05/30/18   [name], MD  Melatonin 3 MG TABS Take by mouth. Taking 2 Tablets QHS    [provider]  Nutritional Supplements (PEDIASURE 1.5 CAL) LIQD Drink one can twice a day 08/24/17   [name], MD  Pediatric Multi Vit-Extra C-FA ([employer] CHILDRENS MULTIVIT/EXTRA C) CHEW Chew 1 tablet by mouth daily.    [provider]  predniSONE (DELTASONE) 10 MG tablet Take 2 tablets (20 mg total) by mouth 2 (two) times daily. 08/02/18   [name], MD    Allergies    Amoxicillin  Review of Systems   Review of Systems  Constitutional: Negative for activity change, appetite change and fever.  HENT: Negative for congestion.   Respiratory: Negative for cough, chest tightness and shortness of breath.   Cardiovascular: Negative for chest pain.  Gastrointestinal: Negative for abdominal pain, nausea and vomiting.  Genitourinary: Negative for dysuria and hematuria.  Musculoskeletal: Negative for arthralgias and myalgias.  Skin: Positive for wound.  Neurological: Negative for dizziness, weakness and headaches.  Psychiatric/Behavioral: Positive for behavioral problems, decreased concentration, dysphoric mood, hallucinations, self-injury, sleep disturbance and suicidal ideas. The patient is nervous/anxious.     all other systems are negative except as noted in the HPI and PMH.   Physical Exam Updated Vital Signs BP (!) 142/91 (BP Location: Right Arm)   Pulse 74   Temp 98.5 F (36.9 C) (Oral)   Resp 16   Ht 5\' 7"  (1.702 m)   Wt 52.2 kg   SpO2 100%   BMI 18.01 kg/m   Physical Exam Vitals and nursing note reviewed.  Constitutional:      General: He is not in acute distress.    Appearance: He is well-developed.     Comments: Calm and cooperative, flat affect  HENT:     Head: Normocephalic and atraumatic.     Mouth/Throat:     Pharynx: No oropharyngeal exudate.  Eyes:     Conjunctiva/sclera: Conjunctivae normal.      Pupils: Pupils are equal, round, and reactive to light.  Neck:     Comments: No meningismus.  Ecchymosis to left side of the neck.  Patient states these are hickeys and not an attempt at self-harm.  No hematoma.  No stridor Cardiovascular:     Rate and Rhythm: Normal rate and regular rhythm.     Heart sounds: Normal heart sounds. No murmur heard.   Pulmonary:     Effort: Pulmonary effort is normal. No respiratory distress.  Breath sounds: Normal breath sounds.  Abdominal:     Palpations: Abdomen is soft.     Tenderness: There is no abdominal tenderness. There is no guarding or rebound.  Musculoskeletal:        General: No tenderness. Normal range of motion.     Cervical back: Normal range of motion and neck supple.  Skin:    General: Skin is warm.  Neurological:     Mental Status: He is alert and oriented to person, place, and time.     Cranial Nerves: No cranial nerve deficit.     Motor: No abnormal muscle tone.     Coordination: Coordination normal.     Comments: No ataxia on finger to nose bilaterally. No pronator drift. 5/5 strength throughout. CN 2-12 intact.Equal grip strength. Sensation intact.   Psychiatric:        Behavior: Behavior normal.     ED Results / Procedures / Treatments   Labs (all labs ordered are listed, but only abnormal results are displayed) Labs Reviewed  SALICYLATE LEVEL - Abnormal; Notable for the following components:      Result Value   Salicylate Lvl <7.0 (*)    All other components within normal limits  COMPREHENSIVE METABOLIC PANEL - Abnormal; Notable for the following components:   Total Bilirubin 2.2 (*)    All other components within normal limits  ACETAMINOPHEN LEVEL - Abnormal; Notable for the following components:   Acetaminophen (Tylenol), Serum <10 (*)    All other components within normal limits  RESP PANEL BY RT-PCR (FLU A&B, COVID) ARPGX2  RAPID URINE DRUG SCREEN, HOSP PERFORMED  ETHANOL  CBC WITH  DIFFERENTIAL/PLATELET    EKG None  Radiology No results found.  Procedures Procedures   Medications Ordered in ED Medications - No data to display  ED Course  I have reviewed the triage vital signs and the nursing notes.  Pertinent labs & imaging results that were available during my care of the patient were reviewed by me and considered in my medical decision making (see chart for details).    MDM Rules/Calculators/A&P                         Depression with suicidal thoughts and plan to walk into traffic or overdose.  He is calm and cooperative.  Stable vitals.  Screening labs are sent.  He will need TTS evaluation.  He is voluntary at this time.  Screening labs are reassuring. Nonspecific bilirubin elevation, mild. No abdominal pain. Holding orders are placed. Patient medically clear for psychiatric evaluation Final Clinical Impression(s) / ED Diagnoses Final diagnoses:  None    Rx / DC Orders ED Discharge Orders    None       Ksean Vale, Jeannett Senior, MD 11/16/20 0201

## 2020-11-16 DIAGNOSIS — F333 Major depressive disorder, recurrent, severe with psychotic symptoms: Secondary | ICD-10-CM | POA: Insufficient documentation

## 2020-11-16 LAB — RESP PANEL BY RT-PCR (FLU A&B, COVID) ARPGX2
Influenza A by PCR: NEGATIVE
Influenza B by PCR: NEGATIVE
SARS Coronavirus 2 by RT PCR: NEGATIVE

## 2020-11-16 LAB — ACETAMINOPHEN LEVEL: Acetaminophen (Tylenol), Serum: <10 ug/mL — ABNORMAL LOW (ref 10–30)

## 2020-11-16 NOTE — ED Notes (Signed)
Kennedy Bucker, RN from [facility] called to inform pt has been accepted by Dr Beryl Meager and instructed to call report to [phone] when transport arrives to pick  Up pt.

## 2020-11-16 NOTE — ED Notes (Signed)
This nurse spoke with Selena Batten, RN house supervisor at [facility] who reports pt has not been accepted yet b/c his covid test was not back yet. Informed Kim, RN that covid test has resulted and is negative. Selena Batten, RN explains that now that the test has resulted, pt placement/transfer request can be reviewed by Dr and someone will call this RN back regarding admission of this pt.

## 2020-11-16 NOTE — ED Notes (Signed)
This nurse spoke with Aurea Graff, [facility] from [facility] who reports they have a bed available now. Instructed to call RN at [facility] back and tell them we no longer need the bed. This RN called [facility] to report pt will no longer be coming there.

## 2020-11-16 NOTE — Progress Notes (Signed)
Per Carolyn Reger,NP, patient meets criteria for inpatient treatment. There are no available or appropriate beds at [facility] today. CSW faxed referrals to the following facilities for review:   [facility]  TTS will continue to seek bed placement.  Cole Ashley, MSW, LCSW-A, LCAS-A Phone: [phone] Disposition/TOC  

## 2020-11-16 NOTE — BH Assessment (Signed)
Comprehensive Clinical Assessment (CCA) Note  11/16/2020 Cole Ashley [id number]   Disposition:  Per Vernard Gambles, NP Inpatient Treatment is recommended  The patient demonstrates the following risk factors for suicide: Chronic risk factors for suicide include:long history of depression, minimal support and isolation. Prior suicidal thoughts and hospitalization.  Patient  Acute risk factors for suicide include: Patient is suicidal with plan, has relationship issues, low self-esteem, impulsive thinking. Protective factors for this patient include: Patient has a desire to live and be in a relationships with his girlfriend.  Patient has no drug or alcohol use, he denies HI/Psychosis. Considering these factors, the overall suicide risk at this point appears to low.  Patient is appropriate for outpatient treatment.   PHQ2-9   Flowsheet Row ED from 11/15/2020 in [place] EMERGENCY DEPARTMENT  PHQ-2 Total Score 4  PHQ-9 Total Score 19    Flowsheet Row ED from 11/15/2020 in [facility] EMERGENCY DEPARTMENT  C-SSRS RISK CATEGORY High Risk     Patient presented to the [facility] with suicidal ideation. Patient states that he has been in a relationship with his girlfriend for the past four months and he states that they are living together. Patient states that he had some infidelity on his part and his girlfriend is aware of it and he states that this incident sparked conflict in his relationship. Patient states that he is now having to try to prove himself to his girlfriend and he states that nothing he does is never enough and he states that he feels like he is a disappointment to her. patient states that he has been distraught over this situation and states that he is trying to do anyting that he can do to try to maintain this relationship. Patient states that he has never attempted suicide in the past, but over the past week he has tried to OD on Concerta and Benadryl, he has thought about wrecking his car  or drowning himself. Patient states that he has never been in a psychiatric unit in the past, but states that he does see Dr. Jannifer Franklin for outpatient treatment and medication management.. Patient denies any HI/Psychosis or substance use.  Patient states that he sleeps on average 6-7 hours per night.  He states that hehas not been eating well, but states that he has not lost any weight.  He denies any history of abuse or self-mutilation.  Patient presents as alert and oriented.  His mood is depressed and his affect is flat.  His thoughts are organized and his memory is intact.  He does not appear to be responding to any internal stimuli.  His judgment, insight and impulse control appear to be impaired.   Chief Complaint:  Chief Complaint  Patient presents with  . Medical Clearance  . Suicidal   Visit Diagnosis: F33.2 MDD Recurrent Severe   CCA Screening, Triage and Referral (STR)  Patient Reported Information How did you hear about [place]? Self  Referral name: No data recorded Referral phone number: No data recorded  Whom do you see for routine medical problems? Primary Care  Practice/Facility Name: [facility]  Practice/Facility Phone Number: No data recorded Name of Contact: No data recorded Contact Number: No data recorded Contact Fax Number: No data recorded Prescriber Name: No data recorded Prescriber Address (if known): No data recorded  What Is the Reason for Your Visit/Call Today? Patient is experiencing suicidal ideation  How Long Has This Been Causing You Problems? 1 wk - 1 month  What Do You Feel Would Help You the Most Today? Treatment for Depression or other mood problem   Have You Recently Been in Any Inpatient Treatment (Hospital/Detox/Crisis Center/28-Day Program)? No  Name/Location of Program/Hospital:No data recorded How Long Were You There? No data recorded When Were You Discharged? No data recorded  [facility] You [facility]? Yes  [facility] Patient has been seen in the ED in the past   Have You Recently Had Any Thoughts About Hurting Yourself? Yes  Are You Planning to Commit Suicide/Harm Yourself At This time? No   Have you Recently Had Thoughts About Hurting Someone Karolee Ohslse? No  Explanation: No data recorded  Have You Used Any Alcohol or Drugs in the Past 24 Hours? No  How Long Ago Did You Use Drugs or Alcohol? No data recorded What Did You Use and How Much? No data recorded  Do You Currently Have a Therapist/Psychiatrist? Yes  Name of Therapist/Psychiatrist: Patient is seen by Dr. Jannifer FranklinAkintayo   Have You Been Recently Discharged From Any Office Practice or Programs? No  Explanation of Discharge From Practice/Program: No data recorded    CCA Screening Triage Referral Assessment Type of Contact: Tele-Assessment  Is this Initial or Reassessment? Initial Assessment  Date Telepsych consult ordered in [facility]:  11/15/2020  [facility]  [id number]   Patient Reported Information Reviewed? Yes  Patient Left Without Being Seen? No data recorded Reason for Not Completing Assessment: No data recorded  Collateral Involvement: np collateral information available   Does Patient Have a Court Appointed Legal Guardian? No data recorded Name and Contact of Legal Guardian: No data recorded If Minor and Not Living with Parent(s), Who has Custody? No data recorded Is CPS involved or ever been involved? Never  Is APS involved or ever been involved? Never   Patient Determined To Be At Risk for Harm To Self or Others Based on Review of Patient Reported Information or Presenting Complaint? Yes, for Self-Harm  Method: No data recorded Availability of Means: No data recorded Intent: No data recorded Notification Required: No data recorded Additional Information for Danger to Others Potential: No data recorded Additional Comments for Danger  to Others Potential: No data recorded Are There Guns or Other Weapons in Your Home? No data recorded Types of Guns/Weapons: No data recorded Are These Weapons Safely Secured?                            No data recorded Who Could Verify You Are Able To Have These Secured: No data recorded Do You Have any Outstanding Charges, Pending Court Dates, Parole/Probation? No data recorded Contacted To Inform of Risk of Harm To Self or Others: Other: Comment (patient is not willing to have staff inform his family)   Location of Assessment: [facility]   Does Patient Present under Involuntary Commitment? No  IVC Papers Initial File Date: No data recorded  [place]   Patient Currently Receiving the Following Services: Medication Management   Determination of Need: Emergent (2 hours)   Options For Referral: Inpatient Hospitalization     CCA Biopsychosocial Intake/Chief Complaint:  Patient presented to the [facility] with suicidal ideation. Patient states that he has been in a relationship with his girlfriend for the past four months and he states that they are living together. Patient states that he had some infidelity on his part and  his girlfriend is aware of it and he states that this incident sparked conflict in his relationship. Patient states that he is now having to try to prove himself to his girlfriend and he states that nothing he does is never enough and he states that he feels like he is a disappointment to her. patient states that he has been distraught over this situation and states that he is trying to do anyting that he can do to try to maintain this relationship. Patient states that he has never attempted suicide in the past, but over the past week he has tried to OD on Concerta and Benadryl, he has thought about wrecking his car or drowning himself. Patient states that he has never been in a psychiatric unit in the past, but states that he does see Dr. Jannifer Franklin for  outpatient treatment and medication management.. Patient denies any HI/Psychosis or substance use.  Patient states that he sleeps on average 6-7 hours per night.  He states that hehas not been eating well, but states that he has not lost any weight.  He denies any history of abuse or self-mutilation.  Current Symptoms/Problems: Patient states that he feels depressed and hopeless   Patient Reported Schizophrenia/Schizoaffective Diagnosis in Past: No   Strengths: patient states that he is outgoing, friendly and understanding  Preferences: patient has no special needs that require accommodation  Abilities: Patient states that he is good at basketball   Type of Services Patient Feels are Needed: Inpatient Treatment   Initial Clinical Notes/Concerns: No data recorded  Mental Health Symptoms Depression:  Change in energy/activity; Hopelessness; Increase/decrease in appetite; Sleep (too much or little); Worthlessness   Duration of Depressive symptoms: Greater than two weeks   Mania:  None   Anxiety:   N/A   Psychosis:  None   Duration of Psychotic symptoms: No data recorded  Trauma:  None   Obsessions:  None   Compulsions:  None   Inattention:  None   Hyperactivity/Impulsivity:  N/A   Oppositional/Defiant Behaviors:  None   Emotional Irregularity:  Potentially harmful impulsivity   Other Mood/Personality Symptoms:  No data recorded   Mental Status Exam Appearance and self-care  Stature:  Average   Weight:  Average weight   Clothing:  Casual; Neat/clean   Grooming:  Normal   Cosmetic use:  None   Posture/gait:  Normal   Motor activity:  Not Remarkable   Sensorium  Attention:  Normal   Concentration:  Normal   Orientation:  Object; Person; Place; Situation; Time   Recall/memory:  Normal   Affect and Mood  Affect:  Depressed; Flat   Mood:  Depressed; Hopeless; Worthless   Relating  Eye contact:  Normal   Facial expression:  Depressed   Attitude  toward examiner:  Cooperative   Thought and Language  Speech flow: Clear and Coherent   Thought content:  Appropriate to Mood and Circumstances   Preoccupation:  None   Hallucinations:  None   Organization:  No data recorded  [employer] of Knowledge:  Good   Intelligence:  Average   Abstraction:  Normal   Judgement:  Impaired   Reality Testing:  Adequate   Insight:  Lacking   Decision Making:  Impulsive   Social Functioning  Social Maturity:  Impulsive   Social Judgement:  Normal   Stress  Stressors:  Relationship   Coping Ability:  Deficient supports; Overwhelmed   Skill Deficits:  Decision making   Supports:  Support needed  Religion: Religion/Spirituality Are You A Religious Person?:  (not assessed)  Leisure/Recreation: Leisure / Recreation Do You Have Hobbies?: Yes Leisure and Hobbies: basketball and football  Exercise/Diet: Exercise/Diet Do You Exercise?: No Have You Gained or Lost A Significant Amount of Weight in the Past Six Months?: No Do You Follow a Special Diet?: No Do You Have Any Trouble Sleeping?: Yes Explanation of Sleeping Difficulties: sleeps 6-7 hours   CCA Employment/Education Employment/Work Situation: Employment / Work Situation Employment situation: Unemployed Patient's job has been impacted by current illness: No What is the longest time patient has a held a job?: 1.5 years 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 Has patient ever been in the [employer]?: No  Education: Education Is Patient Currently Attending School?: No Last Grade Completed: 12 Name of High School: [employer] Did Garment/textile technologist From [employer]?: No Did You Product manager?: No Did Designer, television/film set?: No Did You Have An Individualized Education Program (IIEP): No Did You Have Any Difficulty At [employer]?: No Patient's Education Has Been Impacted by Current Illness: No   CCA Family/Childhood  History Family and Relationship History: Family history Marital status: Single Are you sexually active?: Yes What is your sexual orientation?: heterosexual Has your sexual activity been affected by drugs, alcohol, medication, or emotional stress?: not assessed Does patient have children?: No  Childhood History:  Childhood History By whom was/is the patient raised?: Both parents Description of patient's relationship with caregiver when they were a child: patient states that he was relatively close to his parents growing up Patient's description of current relationship with people who raised him/her: patient states that he is not close to his parents now because he states that they never take him seriously How were you disciplined when you got in trouble as a child/adolescent?: not assessed, but patient denies any history of abuse Does patient have siblings?: Yes Number of Siblings: 3 Description of patient's current relationship with siblings: patient states that he is not close to his siblings Did patient suffer any verbal/emotional/physical/sexual abuse as a child?: No Did patient suffer from severe childhood neglect?: No Has patient ever been sexually abused/assaulted/raped as an adolescent or adult?: No Was the patient ever a victim of a crime or a disaster?: No Witnessed domestic violence?: No Has patient been affected by domestic violence as an adult?: No  Child/Adolescent Assessment:     CCA Substance Use Alcohol/Drug Use: Alcohol / Drug Use Pain Medications: see MAR Prescriptions: see MAR Over the Counter: See MAR History of alcohol / drug use?: No history of alcohol / drug abuse Longest period of sobriety (when/how long): N/A                         ASAM's:  Six Dimensions of Multidimensional Assessment  Dimension 1:  Acute Intoxication and/or Withdrawal Potential:      Dimension 2:  Biomedical Conditions and Complications:      Dimension 3:  Emotional,  Behavioral, or Cognitive Conditions and Complications:     Dimension 4:  Readiness to Change:     Dimension 5:  Relapse, Continued use, or Continued Problem Potential:     Dimension 6:  Recovery/Living Environment:     ASAM Severity Score:    ASAM Recommended Level of Treatment:     Substance use Disorder (SUD)    Recommendations for Services/Supports/Treatments:    DSM5 Diagnoses: Patient Active Problem List   Diagnosis Date Noted  . Severe episode  of recurrent major depressive disorder, with psychotic features (HCC)   . 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 OCD (obsessive compulsive disorder) 06/03/2012  . Chronic motor tic 06/03/2012  . Unspecified episodic mood disorder 07/08/2011  . ADHD (attention deficit hyperactivity disorder), combined type 07/08/2011  . ODD (oppositional defiant disorder) 07/08/2011       Referrals to Alternative Service(s): Referred to Alternative Service(s):   Place:   Date:   Time:    Referred to Alternative Service(s):   Place:   Date:   Time:    Referred to Alternative Service(s):   Place:   Date:   Time:    Referred to Alternative Service(s):   Place:   Date:   Time:     Janijah Symons J Kenston Longton, LCAS

## 2020-11-16 NOTE — BH Assessment (Addendum)
Per [facility], RN pt has been accepted at [facility] and can arrive at any time.  Room: 300-1 Accepting: Cecilio Asper, NP Attending: Dr. Jola Babinski Call to Report: [phone]  Pt's providers were relayed this information at 2211.

## 2020-11-16 NOTE — ED Notes (Signed)
Spoke with Kennedy Bucker at [facility] who reports needing the covid results faxed prior to having the Dr review the pt for admission- govid results were faxed with confirmation.

## 2020-11-17 ENCOUNTER — Other Ambulatory Visit: Payer: Self-pay

## 2020-11-17 ENCOUNTER — Inpatient Hospital Stay (HOSPITAL_COMMUNITY)
Admission: RE | Admit: 2020-11-17 | Discharge: 2020-11-18 | DRG: 885 | Disposition: A | Discharge status: Home / Self Care | Payer: Medicaid Other | Source: Intra-hospital | Attending: Psychiatry | Admitting: Psychiatry

## 2020-11-17 ENCOUNTER — Encounter (HOSPITAL_COMMUNITY): Payer: Self-pay | Admitting: Urology

## 2020-11-17 DIAGNOSIS — Z20822 Contact with and (suspected) exposure to covid-19: Secondary | ICD-10-CM | POA: Diagnosis present

## 2020-11-17 DIAGNOSIS — R45851 Suicidal ideations: Secondary | ICD-10-CM | POA: Diagnosis present

## 2020-11-17 DIAGNOSIS — G4700 Insomnia, unspecified: Secondary | ICD-10-CM | POA: Diagnosis present

## 2020-11-17 DIAGNOSIS — F4323 Adjustment disorder with mixed anxiety and depressed mood: Secondary | ICD-10-CM

## 2020-11-17 DIAGNOSIS — F332 Major depressive disorder, recurrent severe without psychotic features: Principal | ICD-10-CM | POA: Diagnosis present

## 2020-11-17 MED ORDER — ACETAMINOPHEN 325 MG PO TABS: 650.0000 mg | ORAL_TABLET | Freq: Four times a day (QID) | ORAL | Status: DC | PRN

## 2020-11-17 MED ORDER — HYDROXYZINE HCL 25 MG PO TABS: 25.0000 mg | ORAL_TABLET | Freq: Three times a day (TID) | ORAL | Status: DC | PRN

## 2020-11-17 MED ORDER — ESCITALOPRAM OXALATE 10 MG PO TABS
10.0000 mg | ORAL_TABLET | Freq: Every day | ORAL | Status: DC
  Administered 2020-11-17 – 2020-11-18 (×2): 10 mg via ORAL
  Filled 2020-11-17 (×5): qty 1

## 2020-11-17 MED ORDER — ALUM & MAG HYDROXIDE-SIMETH 200-200-20 MG/5ML PO SUSP: 30.0000 mL | ORAL | Status: DC | PRN

## 2020-11-17 MED ORDER — LAMOTRIGINE 25 MG PO TABS
25.0000 mg | ORAL_TABLET | Freq: Every day | ORAL | Status: DC
  Administered 2020-11-17 – 2020-11-18 (×2): 25 mg via ORAL
  Filled 2020-11-17 (×5): qty 1

## 2020-11-17 MED ORDER — MAGNESIUM HYDROXIDE 400 MG/5ML PO SUSP: 30.0000 mL | Freq: Every day | ORAL | Status: DC | PRN

## 2020-11-17 MED ORDER — TRAZODONE HCL 50 MG PO TABS: 50.0000 mg | ORAL_TABLET | Freq: Every evening | ORAL | Status: DC | PRN

## 2020-11-17 NOTE — Tx Team (Signed)
Initial Treatment Plan 11/17/2020 2:04 AM Cole Ashley [id number]    PATIENT STRESSORS: Marital or family conflict Medication change or noncompliance   PATIENT STRENGTHS: General fund of knowledge Motivation for treatment/growth Supportive family/friends   PATIENT IDENTIFIED PROBLEMS: Risk for SI  depression  "mental state"                 DISCHARGE CRITERIA:  Improved stabilization in mood, thinking, and/or behavior Verbal commitment to aftercare and medication compliance  PRELIMINARY DISCHARGE PLAN: Attend aftercare/continuing care group Attend PHP/IOP Outpatient therapy  PATIENT/FAMILY INVOLVEMENT: This treatment plan has been presented to and reviewed with the patient, Cole Ashley.  The patient and family have been given the opportunity to ask questions and make suggestions.  Delos Haring, RN 11/17/2020, 2:04 AM

## 2020-11-17 NOTE — [facility] Notes (Signed)
D. Pt presents with an anxious affect/mood- cooperative but somewhat guarded during interactions. Per pt's self inventory, pt rated his depression, hopelessness and anxiety all 0's today. Pt reports that his goal is to work on "keeping my mind focused on seeing the positive". Pt observed in the dayroom coloring by himself, minimal interactions with others.Pt currently denies SI/HI and AVH  A. Labs and vitals monitored. Pt given and educated on medications. Pt supported emotionally and encouraged to express concerns and ask questions.   R. Pt remains safe with 15 minute checks. Will continue POC.

## 2020-11-17 NOTE — [facility] Notes (Signed)
[place] NOVEL CORONAVIRUS (COVID-19) DAILY CHECK-OFF SYMPTOMS - answer yes or no to each - every day NO YES  Have you had a fever in the past 24 hours?  . Fever (Temp > 37.80C / 100F) X   Have you had any of these symptoms in the past 24 hours? . New Cough .  Sore Throat  .  Shortness of Breath .  Difficulty Breathing .  Unexplained Body Aches   X   Have you had any one of these symptoms in the past 24 hours not related to allergies?   . Runny Nose .  Nasal Congestion .  Sneezing   X   If you have had runny nose, nasal congestion, sneezing in the past 24 hours, has it worsened?  X   EXPOSURES - check yes or no X   Have you traveled outside the state in the past 14 days?  X   Have you been in contact with someone with a confirmed diagnosis of COVID-19 or PUI in the past 14 days without wearing appropriate PPE?  X   Have you been living in the same home as a person with confirmed diagnosis of COVID-19 or a PUI (household contact)?    X   Have you been diagnosed with COVID-19?    X              What to do next: Answered NO to all: Answered YES to anything:   Proceed with unit schedule Follow the [facility] Inpatient Flowsheet.   

## 2020-11-17 NOTE — H&P (Signed)
Psychiatric Admission Assessment Adult  Patient Identification: Cole Ashley MRN:  [id number] Date of Evaluation:  11/17/2020 Chief Complaint:  MDD (major depressive disorder), recurrent episode, severe (HCC) [F33.2] Principal Diagnosis: <principal problem not specified> Diagnosis:  Active Problems:   MDD (major depressive disorder), recurrent episode, severe (HCC)  History of Present Illness: Patient is seen and examined.  Patient is a 20 year old male with a reported past psychiatric history significant for attention deficit disorder as well as mood instability who presented to the [facility] emergency department on 11/15/2020 with suicidal ideation.  The patient had gotten into an argument with his girlfriend as well as his parents, and admitted to suicidal ideation.  The patient reported that he was planning to overdose on medications.  The patient stated that he had made mistakes in his relationship.  He stated that he been unfaithful to his girlfriend, and she became angry over this.  He had previously been treated with psychiatric medicines per [facility].  He had been previously prescribed Lamictal as well as Concerta.  Review of the PMP database revealed that he had not had these medications at least since January of this year.  He currently denies suicidal ideation.  He just wants to get back on his medications and feel better.  He stated that he and his girlfriend have discussed the situation, and she is willing to let him come back to live with her.  He was admitted to the hospital for evaluation and stabilization.  Associated Signs/Symptoms: Depression Symptoms:  depressed mood, anhedonia, insomnia, psychomotor agitation, fatigue, feelings of worthlessness/guilt, difficulty concentrating, hopelessness, suicidal thoughts without plan, anxiety, loss of energy/fatigue, disturbed sleep, Duration of Depression Symptoms: Greater than two weeks  (Hypo) Manic Symptoms:   Impulsivity, Irritable Mood, Labiality of Mood, Anxiety Symptoms:  Excessive Worry, Psychotic Symptoms:  Denied PTSD Symptoms: Had a traumatic exposure:  In the past Total Time spent with patient: 30 minutes  Past Psychiatric History: Patient denied any previous psychiatric admissions.  He has been treated by Dr. Jannifer Franklin as an outpatient.  He has been previously treated with Concerta as well as Lamictal.  Is the patient at risk to self? Yes.    Has the patient been a risk to self in the past 6 months? No.  Has the patient been a risk to self within the distant past? No.  Is the patient a risk to others? No.  Has the patient been a risk to others in the past 6 months? No.  Has the patient been a risk to others within the distant past? No.   Prior Inpatient Therapy:   Prior Outpatient Therapy:    Alcohol Screening: 1. How often do you have a drink containing alcohol?: Never 2. How many drinks containing alcohol do you have on a typical day when you are drinking?: 1 or 2 3. How often do you have six or more drinks on one occasion?: Never AUDIT-C Score: 0 4. How often during the last year have you found that you were not able to stop drinking once you had started?: Never 5. How often during the last year have you failed to do what was normally expected from you because of drinking?: Never 6. How often during the last year have you needed a first drink in the morning to get yourself going after a heavy drinking session?: Never 7. How often during the last year have you had a feeling of guilt of remorse after drinking?: Never 8. How often during the last year have  you been unable to remember what happened the night before because you had been drinking?: Never 9. Have you or someone else been injured as a result of your drinking?: No 10. Has a relative or friend or a doctor or another health worker been concerned about your drinking or suggested you cut down?: No Alcohol Use Disorder  Identification Test Final Score (AUDIT): 0 Substance Abuse History in the last 12 months:  No. Consequences of Substance Abuse: Negative Previous Psychotropic Medications: Yes  Psychological Evaluations: Yes  Past Medical History:  Past Medical History:  Diagnosis Date  . ADHD (attention deficit hyperactivity disorder)   . Asthma   . Innocent heart murmur   . Insomnia 09/04/2003  . Oppositional defiant disorder   . Seasonal allergies   . Unspecified episodic mood disorder   . Wears glasses     Past Surgical History:  Procedure Laterality Date  . CIRCUMCISION  09/06/1999  . tubes in ears     in the past   Family History:  Family History  Problem Relation Age of Onset  . Bipolar disorder Mother   . Migraines Mother   . Anxiety disorder Mother   . ADD / ADHD Brother   . Seizures Brother   . Migraines Brother   . Anxiety disorder Brother   . Asthma Brother   . ADD / ADHD Brother   . OCD Brother   . Migraines Brother   . Insomnia Brother   . ADD / ADHD Sister   . Alcohol abuse Father   . Anxiety disorder Maternal Grandfather   . Alcohol abuse Paternal Grandfather   . Anxiety disorder Paternal Grandmother        PGGM  . Dementia Neg Hx   . Depression Neg Hx   . Drug abuse Neg Hx   . Schizophrenia Neg Hx   . Paranoid behavior Neg Hx   . Sexual abuse Neg Hx   . Physical abuse Neg Hx    Family Psychiatric  History: Mother with unspecified psychiatric illness Tobacco Screening: Have you used any form of tobacco in the last 30 days? (Cigarettes, Smokeless Tobacco, Cigars, and/or Pipes): No Social History:  Social History   Substance and Sexual Activity  Alcohol Use No     Social History   Substance and Sexual Activity  Drug Use No    Additional Social History:                           Allergies:   Allergies  Allergen Reactions  . Amoxicillin Itching, Swelling and Rash    Face swelling   Lab Results:  Results for orders placed or performed  during the hospital encounter of 11/15/20 (from the past 48 hour(s))  Rapid urine drug screen (hospital performed)     Status: None   Collection Time: 11/15/20  9:53 PM  Result Value Ref Range   Opiates NONE DETECTED NONE DETECTED   Cocaine NONE DETECTED NONE DETECTED   Benzodiazepines NONE DETECTED NONE DETECTED   Amphetamines NONE DETECTED NONE DETECTED   Tetrahydrocannabinol NONE DETECTED NONE DETECTED   Barbiturates NONE DETECTED NONE DETECTED    Comment: (NOTE) DRUG SCREEN FOR MEDICAL PURPOSES ONLY.  IF CONFIRMATION IS NEEDED FOR ANY PURPOSE, NOTIFY LAB WITHIN 5 DAYS.  LOWEST DETECTABLE LIMITS FOR URINE DRUG SCREEN Drug Class                     Cutoff (ng/mL) Amphetamine  and metabolites    1000 Barbiturate and metabolites    200 Benzodiazepine                 200 Tricyclics and metabolites     300 Opiates and metabolites        300 Cocaine and metabolites        300 THC                            50 Performed at [facility], [address], [place], [place] [address]   Salicylate level     Status: Abnormal   Collection Time: 11/15/20  9:57 PM  Result Value Ref Range   Salicylate Lvl <7.0 (L) 7.0 - 30.0 mg/dL    Comment: Performed at [facility], [address], [place], [place] [address]  Ethanol     Status: None   Collection Time: 11/15/20  9:57 PM  Result Value Ref Range   Alcohol, Ethyl (B) <10 <10 mg/dL    Comment: (NOTE) Lowest detectable limit for serum alcohol is 10 mg/dL.  For medical purposes only. Performed at [facility], [address], [place], [place] [address]   CBC with Differential/Platelet     Status: None   Collection Time: 11/15/20  9:57 PM  Result Value Ref Range   WBC 10.1 4.0 - 10.5 K/uL   RBC 5.18 4.22 - 5.81 MIL/uL   Hemoglobin 15.4 13.0 - 17.0 g/dL   HCT 74.2 59.5 - 63.8 %   MCV 90.9 80.0 - 100.0 fL   MCH 29.7 26.0 - 34.0 pg   MCHC 32.7 30.0 - 36.0 g/dL   RDW 75.6 43.3 - 29.5 %   Platelets 271 150 - 400 K/uL   nRBC 0.0 0.0 - 0.2 %    Neutrophils Relative % 70 %   Neutro Abs 7.1 1.7 - 7.7 K/uL   Lymphocytes Relative 21 %   Lymphs Abs 2.1 0.7 - 4.0 K/uL   Monocytes Relative 8 %   Monocytes Absolute 0.8 0.1 - 1.0 K/uL   Eosinophils Relative 0 %   Eosinophils Absolute 0.0 0.0 - 0.5 K/uL   Basophils Relative 1 %   Basophils Absolute 0.1 0.0 - 0.1 K/uL   Immature Granulocytes 0 %   Abs Immature Granulocytes 0.01 0.00 - 0.07 K/uL    Comment: Performed at [facility], [address] [place], [place] [address]  Comprehensive metabolic panel     Status: Abnormal   Collection Time: 11/15/20  9:57 PM  Result Value Ref Range   Sodium 139 135 - 145 mmol/L   Potassium 3.9 3.5 - 5.1 mmol/L   Chloride 102 98 - 111 mmol/L   CO2 27 22 - 32 mmol/L   Glucose, Bld 87 70 - 99 mg/dL    Comment: Glucose reference range applies only to samples taken after fasting for at least 8 hours.   BUN 12 6 - 20 mg/dL   Creatinine, Ser 6.60 0.61 - 1.24 mg/dL   Calcium 9.8 8.9 - 63.0 mg/dL   Total Protein 7.4 6.5 - 8.1 g/dL   Albumin 5.0 3.5 - 5.0 g/dL   AST 17 15 - 41 U/L   ALT 14 0 - 44 U/L   Alkaline Phosphatase 72 38 - 126 U/L   Total Bilirubin 2.2 (H) 0.3 - 1.2 mg/dL   GFR, Estimated >16 >01 mL/min    Comment: (NOTE) Calculated using the CKD-EPI Creatinine Equation (2021)  Anion gap 10 5 - 15    Comment: Performed at [facility], [address] [place], [place] [address]  Acetaminophen level     Status: Abnormal   Collection Time: 11/15/20  9:57 PM  Result Value Ref Range   Acetaminophen (Tylenol), Serum <10 (L) 10 - 30 ug/mL    Comment: (NOTE) Therapeutic concentrations vary significantly. A range of 10-30 ug/mL  may be an effective concentration for many patients. However, some  are best treated at concentrations outside of this range. Acetaminophen concentrations >150 ug/mL at 4 hours after ingestion  and >50 ug/mL at 12 hours after ingestion are often associated with  toxic reactions.  Performed at [facility],  [address] [place], [place] [address]   Resp Panel by RT-PCR (Flu A&B, Covid) Nasopharyngeal Swab     Status: None   Collection Time: 11/16/20  4:30 PM   Specimen: Nasopharyngeal Swab; Nasopharyngeal(NP) swabs in vial transport medium  Result Value Ref Range   SARS Coronavirus 2 by RT PCR NEGATIVE NEGATIVE    Comment: (NOTE) SARS-CoV-2 target nucleic acids are NOT DETECTED.  The SARS-CoV-2 RNA is generally detectable in upper respiratory specimens during the acute phase of infection. The lowest concentration of SARS-CoV-2 viral copies this assay can detect is 138 copies/mL. A negative result does not preclude SARS-Cov-2 infection and should not be used as the sole basis for treatment or other patient management decisions. A negative result may occur with  improper specimen collection/handling, submission of specimen other than nasopharyngeal swab, presence of viral mutation(s) within the areas targeted by this assay, and inadequate number of viral copies(<138 copies/mL). A negative result must be combined with clinical observations, patient history, and epidemiological information. The expected result is Negative.  Fact Sheet for Patients:  BloggerCourse.com  Fact Sheet for Healthcare Providers:  SeriousBroker.it  This test is no t yet approved or cleared by the [place] FDA and  has been authorized for detection and/or diagnosis of SARS-CoV-2 by FDA under an Emergency Use Authorization (EUA). This EUA will remain  in effect (meaning this test can be used) for the duration of the COVID-19 declaration under Section 564(b)(1) of the Act, 21 U.S.C.section 360bbb-3(b)(1), unless the authorization is terminated  or revoked sooner.       Influenza A by PCR NEGATIVE NEGATIVE   Influenza B by PCR NEGATIVE NEGATIVE    Comment: (NOTE) The Xpert Xpress SARS-CoV-2/FLU/RSV plus assay is intended as an aid in the diagnosis of influenza  from Nasopharyngeal swab specimens and should not be used as a sole basis for treatment. Nasal washings and aspirates are unacceptable for Xpert Xpress SARS-CoV-2/FLU/RSV testing.  Fact Sheet for Patients: BloggerCourse.com  Fact Sheet for Healthcare Providers: SeriousBroker.it  This test is not yet approved or cleared by the [place] FDA and has been authorized for detection and/or diagnosis of SARS-CoV-2 by FDA under an Emergency Use Authorization (EUA). This EUA will remain in effect (meaning this test can be used) for the duration of the COVID-19 declaration under Section 564(b)(1) of the Act, 21 U.S.C. section 360bbb-3(b)(1), unless the authorization is terminated or revoked.  Performed at [facility], [address], [place], [place] [address]     Blood Alcohol level:  Lab Results  Component Value Date   [facility] <10 11/15/2020    Metabolic Disorder Labs:  No results found for: HGBA1C, MPG No results found for: PROLACTIN No results found for: CHOL, TRIG, HDL, CHOLHDL, VLDL, LDLCALC  Current Medications: Current Facility-Administered Medications  Medication Dose Route Frequency Provider Last Rate Last Admin  . acetaminophen (TYLENOL) tablet 650 mg  650 mg Oral Q6H PRN Ajibola, Ene A, NP      . alum & mag hydroxide-simeth (MAALOX/MYLANTA) 200-200-20 MG/5ML suspension 30 mL  30 mL Oral Q4H PRN Ajibola, Ene A, NP      . escitalopram (LEXAPRO) tablet 10 mg  10 mg Oral Daily Antonieta Pert, MD   10 mg at 11/17/20 1014  . hydrOXYzine (ATARAX/VISTARIL) tablet 25 mg  25 mg Oral TID PRN Ajibola, Ene A, NP      . lamoTRIgine (LAMICTAL) tablet 25 mg  25 mg Oral Daily Antonieta Pert, MD   25 mg at 11/17/20 1014  . magnesium hydroxide (MILK OF MAGNESIA) suspension 30 mL  30 mL Oral Daily PRN Ajibola, Ene A, NP      . traZODone (DESYREL) tablet 50 mg  50 mg Oral QHS PRN Ajibola, Ene A, NP       PTA Medications: Medications  Prior to Admission  Medication Sig Dispense Refill Last Dose  . ARIPiprazole (ABILIFY) 10 MG tablet Take 1 tablet (10 mg total) by mouth daily. (Patient not taking: Reported on 11/16/2020) 30 tablet 2   . atomoxetine (STRATTERA) 40 MG capsule Take 1 capsule (40 mg total) by mouth daily. (Patient not taking: Reported on 11/16/2020) 30 capsule 2   . beclomethasone (QVAR) 80 MCG/ACT inhaler Inhale 2 puffs into the lungs 2 (two) times daily.     . cloNIDine (CATAPRES) 0.1 MG tablet Take 1 tablet (0.1 mg total) by mouth at bedtime. 30 tablet 2   . lamoTRIgine (LAMICTAL) 25 MG tablet Take 100 mg by mouth AC breakfast.     . Melatonin 3 MG TABS Take by mouth. Taking 2 Tablets QHS (Patient not taking: Reported on 11/16/2020)     . mometasone (ASMANEX) 220 MCG/INH inhaler Inhale 2 puffs into the lungs daily.     . Multiple Vitamin (MULTI-VITAMIN) tablet Take 1 tablet by mouth daily.     Marland Kitchen PROAIR HFA 108 (90 BASE) MCG/ACT inhaler Inhale 2 puffs into the lungs every 4 (four) hours as needed. For coughing       Musculoskeletal: Strength & Muscle Tone: within normal limits Gait & Station: normal Patient leans: N/A            Psychiatric Specialty Exam:  Presentation  General Appearance: Appropriate for Environment  Eye Contact:Fair  Speech:Clear and Coherent  Speech Volume:Normal  Handedness:Right   Mood and Affect  Mood:Anxious  Affect:Appropriate   Thought Process  Thought Processes:Goal Directed  Duration of Psychotic Symptoms: No data recorded Past Diagnosis of Schizophrenia or Psychoactive disorder: No  Descriptions of Associations:Intact  Orientation:Full (Time, Place and Person)  Thought Content:Logical  Hallucinations:Hallucinations: None  Ideas of Reference:None  Suicidal Thoughts:Suicidal Thoughts: No  Homicidal Thoughts:Homicidal Thoughts: No   Sensorium  Memory:Immediate Good; Recent Good; Remote Good  Judgment:Fair  Insight:Good   Executive  Functions  Concentration:Good  Attention Span:Good  Recall:Good  Fund of Knowledge:Good  Language:Good   Psychomotor Activity  Psychomotor Activity:Psychomotor Activity: Normal   Assets  Assets:Communication Skills; Desire for Improvement; Housing; Physical Health   Sleep  Sleep:Sleep: Fair Number of Hours of Sleep: 3.5    Physical Exam: Physical Exam Vitals and nursing note reviewed.  Constitutional:      Appearance: Normal appearance.  HENT:     Head: Normocephalic and atraumatic.  Pulmonary:     Effort: Pulmonary effort is normal.  Neurological:  General: No focal deficit present.     Mental Status: He is alert and oriented to person, place, and time.    ROS Blood pressure 104/63, pulse (!) 128, temperature 98.2 F (36.8 C), temperature source Oral, resp. rate 18, height  (1.702 m), weight 50.8 kg, SpO2 100 %. Body mass index is 17.54 kg/m.  Treatment Plan Summary: Daily contact with patient to assess and evaluate symptoms and progress in treatment, Medication management and Plan : Patient is seen and examined.  Patient is a 20 year old male with the above-stated past psychiatric history who was admitted with suicidal ideation.  He will be admitted to the hospital.  He will be integrated in the milieu.  He will be encouraged to attend groups.  We will start Lexapro 10 mg p.o. daily for anxiety and depression.  We also restart his Lamictal 25 mg p.o. daily.  He will also have available trazodone for sleep as well as hydroxyzine for anxiety.  Review of his admission laboratories showed normal electrolytes including liver function enzymes.  His CBC was normal.  Differential was normal.  Respiratory panel was negative for influenza A, B and coronavirus.  Acetaminophen was less than 10, salicylate less than 7.  Blood alcohol was less than 10, drug screen was negative.  He is mildly tachycardic with a rate of 128.  Blood pressure is stable.  He denies suicidal  ideation.  We will monitor him for 24 hours and most likely discharge tomorrow.  Observation Level/Precautions:  15 minute checks  Laboratory:  CBC Chemistry Profile  Psychotherapy:    Medications:    Consultations:    Discharge Concerns:    Estimated LOS:  Other:     Physician Treatment Plan for Primary Diagnosis: <principal problem not specified> Long Term Goal(s): Improvement in symptoms so as ready for discharge  Short Term Goals: Ability to identify changes in lifestyle to reduce recurrence of condition will improve, Ability to verbalize feelings will improve, Ability to disclose and discuss suicidal ideas, Ability to demonstrate self-control will improve, Ability to identify and develop effective coping behaviors will improve, Ability to maintain clinical measurements within normal limits will improve and Compliance with prescribed medications will improve  Physician Treatment Plan for Secondary Diagnosis: Active Problems:   MDD (major depressive disorder), recurrent episode, severe (HCC)  Long Term Goal(s): Improvement in symptoms so as ready for discharge  Short Term Goals: Ability to identify changes in lifestyle to reduce recurrence of condition will improve, Ability to verbalize feelings will improve, Ability to disclose and discuss suicidal ideas, Ability to demonstrate self-control will improve, Ability to identify and develop effective coping behaviors will improve, Ability to maintain clinical measurements within normal limits will improve and Compliance with prescribed medications will improve  I certify that inpatient services furnished can reasonably be expected to improve the patient's condition.    Antonieta Pert, MD 4/24/20221:52 PM
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
## 2020-11-17 NOTE — BHH Suicide Risk Assessment (Signed)
[facility] Admission Suicide Risk Assessment   Nursing information obtained from:  Patient Demographic factors:  Caucasian,Low socioeconomic status,Unemployed Current Mental Status:  Suicidal ideation indicated by patient Loss Factors:  NA Historical Factors:  Prior suicide attempts Risk Reduction Factors:  Positive social support  Total Time spent with patient: 30 minutes Principal Problem: <principal problem not specified> Diagnosis:  Active Problems:   MDD (major depressive disorder), recurrent episode, severe (HCC)  Subjective Data: Patient is seen and examined.  Patient is a 20 year old male with a reported past psychiatric history significant for attention deficit disorder as well as mood instability who presented to the [facility] emergency department on 11/15/2020 with suicidal ideation.  The patient had gotten into an argument with his girlfriend as well as his parents, and admitted to suicidal ideation.  The patient reported that he was planning to overdose on medications.  The patient stated that he had made mistakes in his relationship.  He stated that he been unfaithful to his girlfriend, and she became angry over this.  He had previously been treated with psychiatric medicines per [place].  He had been previously prescribed Lamictal as well as Concerta.  Review of the PMP database revealed that he had not had these medications at least since January of this year.  He currently denies suicidal ideation.  He just wants to get back on his medications and feel better.  He stated that he and his girlfriend have discussed the situation, and she is willing to let him come back to live with her.  He was admitted to the hospital for evaluation and stabilization.  Continued Clinical Symptoms:  Alcohol Use Disorder Identification Test Final Score (AUDIT): 0 The "Alcohol Use Disorders Identification Test", Guidelines for Use in Primary Care, Second Edition.  World Science writer [facility]). Score  between 0-7:  no or low risk or alcohol related problems. Score between 8-15:  moderate risk of alcohol related problems. Score between 16-19:  high risk of alcohol related problems. Score 20 or above:  warrants further diagnostic evaluation for alcohol dependence and treatment.   CLINICAL FACTORS:   Severe Anxiety and/or Agitation Depression:   Impulsivity   Musculoskeletal: Strength & Muscle Tone: within normal limits Gait & Station: normal Patient leans: N/A  Psychiatric Specialty Exam:  Presentation  General Appearance: No data recorded Eye Contact:No data recorded Speech:No data recorded Speech Volume:No data recorded Handedness:No data recorded  Mood and Affect  Mood:No data recorded Affect:No data recorded  Thought Process  Thought Processes:No data recorded Descriptions of Associations:No data recorded Orientation:No data recorded Thought Content:No data recorded History of Schizophrenia/Schizoaffective disorder:No  Duration of Psychotic Symptoms:No data recorded Hallucinations:No data recorded Ideas of Reference:No data recorded Suicidal Thoughts:No data recorded Homicidal Thoughts:No data recorded  Sensorium  Memory:No data recorded Judgment:No data recorded Insight:No data recorded  Executive Functions  Concentration:No data recorded Attention Span:No data recorded Recall:No data recorded Fund of Knowledge:No data recorded Language:No data recorded  Psychomotor Activity  Psychomotor Activity:No data recorded  Assets  Assets:No data recorded  Sleep  Sleep:No data recorded   Physical Exam: Physical Exam Vitals and nursing note reviewed.    ROS Blood pressure 104/63, pulse (!) 128, temperature 98.2 F (36.8 C), temperature source Oral, resp. rate 18, height 5\' 7"  (1.702 m), weight 50.8 kg, SpO2 100 %. Body mass index is 17.54 kg/m.   COGNITIVE FEATURES THAT CONTRIBUTE TO RISK:  None    SUICIDE RISK:   Mild:  Suicidal ideation of  limited frequency, intensity, duration,  and specificity.  There are no identifiable plans, no associated intent, mild dysphoria and related symptoms, good self-control (both objective and subjective assessment), few other risk factors, and identifiable protective factors, including available and accessible social support.  PLAN OF CARE: Patient is seen and examined.  Patient is a 20 year old male with the above-stated past psychiatric history who was admitted with suicidal ideation.  He will be admitted to the hospital.  He will be integrated in the milieu.  He will be encouraged to attend groups.  We will start Lexapro 10 mg p.o. daily for anxiety and depression.  We also restart his Lamictal 25 mg p.o. daily.  He will also have available trazodone for sleep as well as hydroxyzine for anxiety.  Review of his admission laboratories showed normal electrolytes including liver function enzymes.  His CBC was normal.  Differential was normal.  Respiratory panel was negative for influenza A, B and coronavirus.  Acetaminophen was less than 10, salicylate less than 7.  Blood alcohol was less than 10, drug screen was negative.  He is mildly tachycardic with a rate of 128.  Blood pressure is stable.  He denies suicidal ideation.  We will monitor him for 24 hours and most likely discharge tomorrow.  I certify that inpatient services furnished can reasonably be expected to improve the patient's condition.   Antonieta Pert, MD 11/17/2020, 10:58 AM

## 2020-11-17 NOTE — Progress Notes (Signed)
Patient ID: Cole Ashley, male   DOB: Dec 02, 2000, 20 y.o.   MRN: [id number]  Admission Note:  20 yr male who presents [facility] in no acute distress for the treatment of SI and Depression. Pt appears flat and depressed. Pt was calm and cooperative with admission process. Pt presents with passive SI and contracts for safety upon admission. Pt denies AVH /HI / pain at this time . Pt stated he cheated on his GF x 1 month ago and they have been having issues since. Pt stated  He feels like he has disappointed everyone including himself. Pt stated his GF sister has been to [facility] numerous times and she suggested he come to the hospital. Pt has Hx of Asthma , stated he only used CBD gummies   Per Assessment: Patient presented to the [facility] with suicidal ideation. Patient states that he has been in a relationship with his girlfriend for the past four months and he states that they are living together. Patient states that he had some infidelity on his part and his girlfriend is aware of it and he states that this incident sparked conflict in his relationship. Patient states that he is now having to try to prove himself to his girlfriend and he states that nothing he does is never enough and he states that he feels like he is a disappointment to her. patient states that he has been distraught over this situation and states that he is trying to do anyting that he can do to try to maintain this relationship. Patient states that he has never attempted suicide in the past, but over the past week he has tried to OD on Concerta and Benadryl, he has thought about wrecking his car or drowning himself. Patient states that he has never been in a psychiatric unit in the past, but states that he does see Dr. Jannifer Franklin for outpatient treatment and medication management.. Patient denies any HI/Psychosis or substance use.  Patient states that he sleeps on average 6-7 hours per night.  He states that hehas not been eating well, but states that he  has not lost any weight.  He denies any history of abuse or self-mutilation.   A:Skin was assessed and found to be clear of any abnormal marks apart from ecchymosis bilateral neck (hickies per pt). PT searched and no contraband found, POC and unit policies explained and understanding verbalized. Consents obtained. Food and fluids offered, and fluids accepted.   R: Pt had no additional questions or concerns.

## 2020-11-18 DIAGNOSIS — F332 Major depressive disorder, recurrent severe without psychotic features: Principal | ICD-10-CM

## 2020-11-18 MED ORDER — LAMOTRIGINE 25 MG PO TABS: 25.0000 mg | ORAL_TABLET | Freq: Every day | ORAL | 0 refills | Status: AC

## 2020-11-18 MED ORDER — ESCITALOPRAM OXALATE 10 MG PO TABS: 10.0000 mg | ORAL_TABLET | Freq: Every day | ORAL | 0 refills | Status: AC

## 2020-11-18 NOTE — Progress Notes (Signed)
Recreation Therapy Notes  Date: 4.25.22 Time: 0930 Location: [address] Hall Dayroom  Group Topic: Stress Management  Goal Area(s) Addresses:  Patient will identify positive stress management techniques. Patient will identify benefits of using stress management post d/c.  Behavioral Response: Engaged  Intervention: Stress Management  Activity: Meditation.  LRT played a meditation that focused on making the most of your day and putting an emphasis on things they wish to accomplish throughout the day.    Education:  Stress Management, Discharge Planning.   Education Outcome: Acknowledges Education  Clinical Observations/Feedback:  Pt attended and participated in group session.    Caroll Rancher, LRT/CTRS     Caroll Rancher A 11/18/2020 11:47 AM

## 2020-11-18 NOTE — BHH Counselor (Signed)
Adult Comprehensive Assessment  Patient ID: Cole Ashley, male   DOB: [birth year], 20 y.o.   MRN: [id number]  Information Source:    Current Stressors:     Living/Environment/Situation:  Living Arrangements: Spouse/significant other  Family History:     Childhood History:     Education:     Employment/Work Situation:      Surveyor, quantity Resources:      Alcohol/Substance Abuse:      Social Support System:      Leisure/Recreation:      Strengths/Needs:      Discharge Plan:      Summary/Recommendations:  Due to patient's short length of stay, a summary was completed in place of a PSA. Pt is a 20 year old male for treatment of SI and Depression. Pt reported passive SI and contracts for safety upon admission. Pt reports recent stressors are cheating on his girlfriend, which has caused fricition in their relationship. Pt currently lives with his girlfriend and her mother. Patient states that he has never attempted suicide in the past, but over the past week he has tried to OD on Concerta and Benadryl, he has thought about wrecking his car or drowning himself. Patient states that he has never been in a psychiatric unit in the past, but states that he does see Dr. Jannifer Franklin for outpatient treatment and medication management. While here, Dejohn Ibarra can benefit from crisis stabilization, medication management, therapeutic milieu, and referrals for services.    Felizardo Hoffmann. 11/18/2020

## 2020-11-18 NOTE — BHH Suicide Risk Assessment (Signed)
[facility] Discharge Suicide Risk Assessment   Principal Problem: <principal problem not specified> Discharge Diagnoses: Active Problems:   MDD (major depressive disorder), recurrent episode, severe (HCC)   Total Time spent with patient: 15 minutes  Musculoskeletal: Strength & Muscle Tone: within normal limits Gait & Station: normal Patient leans: N/A  Psychiatric Specialty Exam: Review of Systems  All other systems reviewed and are negative.   Blood pressure 117/73, pulse (!) 101, temperature 97.9 F (36.6 C), temperature source Oral, resp. rate 18, height 5\' 7"  (1.702 m), weight 50.8 kg, SpO2 98 %.Body mass index is 17.54 kg/m.  General Appearance: Casual  Eye Contact::  Good  Speech:  Normal Rate409  Volume:  Normal  Mood:  Euthymic  Affect:  Congruent  Thought Process:  Coherent and Descriptions of Associations: Intact  Orientation:  Full (Time, Place, and Person)  Thought Content:  Logical  Suicidal Thoughts:  No  Homicidal Thoughts:  No  Memory:  Immediate;   Good Recent;   Good Remote;   Good  Judgement:  Good  Insight:  Good  Psychomotor Activity:  Normal  Concentration:  Good  Recall:  Good  Fund of Knowledge:Good  Language: Good  Akathisia:  Negative  Handed:  Right  AIMS (if indicated):     Assets:  Desire for Improvement Housing Resilience Social Support  Sleep:  Number of Hours: 6.75  Cognition: WNL  ADL's:  Intact   Mental Status Per Nursing Assessment::   On Admission:  Suicidal ideation indicated by patient  Demographic Factors:  Male, Caucasian, Low socioeconomic status and Unemployed  Loss Factors: Loss of significant relationship  Historical Factors: Impulsivity  Risk Reduction Factors:   Living with another person, especially a relative and Positive social support  Continued Clinical Symptoms:  Bipolar Disorder:   Mixed State Depression:   Impulsivity  Cognitive Features That Contribute To Risk:  None    Suicide Risk:  Minimal:  No identifiable suicidal ideation.  Patients presenting with no risk factors but with morbid ruminations; may be classified as minimal risk based on the severity of the depressive symptoms    Plan Of Care/Follow-up recommendations:  Activity:  ad lib  [phone], MD 11/18/2020, 9:02 AM

## 2020-11-18 NOTE — Tx Team (Signed)
Interdisciplinary Treatment and Diagnostic Plan Update  11/18/2020 Time of Session: 9:00am Travaughn Vue MRN: [id number]  Principal Diagnosis: MDD (major depressive disorder), recurrent episode, severe (HCC)  Secondary Diagnoses: Principal Problem:   MDD (major depressive disorder), recurrent episode, severe (HCC)   Current Medications:  Current Facility-Administered Medications  Medication Dose Route Frequency Provider Last Rate Last Admin  . acetaminophen (TYLENOL) tablet 650 mg  650 mg Oral Q6H PRN Ajibola, Ene A, NP      . alum & mag hydroxide-simeth (MAALOX/MYLANTA) 200-200-20 MG/5ML suspension 30 mL  30 mL Oral Q4H PRN Ajibola, Ene A, NP      . escitalopram (LEXAPRO) tablet 10 mg  10 mg Oral Daily Antonieta Pert, MD   10 mg at 11/18/20 7673  . hydrOXYzine (ATARAX/VISTARIL) tablet 25 mg  25 mg Oral TID PRN Ajibola, Ene A, NP      . lamoTRIgine (LAMICTAL) tablet 25 mg  25 mg Oral Daily Antonieta Pert, MD   25 mg at 11/18/20 0817  . magnesium hydroxide (MILK OF MAGNESIA) suspension 30 mL  30 mL Oral Daily PRN Ajibola, Ene A, NP      . traZODone (DESYREL) tablet 50 mg  50 mg Oral QHS PRN Ajibola, Ene A, NP       PTA Medications: Medications Prior to Admission  Medication Sig Dispense Refill Last Dose  . ARIPiprazole (ABILIFY) 10 MG tablet Take 1 tablet (10 mg total) by mouth daily. (Patient not taking: Reported on 11/16/2020) 30 tablet 2   . atomoxetine (STRATTERA) 40 MG capsule Take 1 capsule (40 mg total) by mouth daily. (Patient not taking: Reported on 11/16/2020) 30 capsule 2   . beclomethasone (QVAR) 80 MCG/ACT inhaler Inhale 2 puffs into the lungs 2 (two) times daily.     . cloNIDine (CATAPRES) 0.1 MG tablet Take 1 tablet (0.1 mg total) by mouth at bedtime. 30 tablet 2   . lamoTRIgine (LAMICTAL) 25 MG tablet Take 100 mg by mouth AC breakfast.     . Melatonin 3 MG TABS Take by mouth. Taking 2 Tablets QHS (Patient not taking: Reported on 11/16/2020)     . mometasone  (ASMANEX) 220 MCG/INH inhaler Inhale 2 puffs into the lungs daily.     . Multiple Vitamin (MULTI-VITAMIN) tablet Take 1 tablet by mouth daily.     Marland Kitchen PROAIR HFA 108 (90 BASE) MCG/ACT inhaler Inhale 2 puffs into the lungs every 4 (four) hours as needed. For coughing       Patient Stressors: Marital or family conflict Medication change or noncompliance  Patient Strengths: Geographical information systems officer for treatment/growth Supportive family/friends  Treatment Modalities: Medication Management, Group therapy, Case management,  1 to 1 session with clinician, Psychoeducation, Recreational therapy.   Physician Treatment Plan for Primary Diagnosis: MDD (major depressive disorder), recurrent episode, severe (HCC) Long Term Goal(s): Improvement in symptoms so as ready for discharge Improvement in symptoms so as ready for discharge   Short Term Goals: Ability to identify changes in lifestyle to reduce recurrence of condition will improve Ability to verbalize feelings will improve Ability to disclose and discuss suicidal ideas Ability to demonstrate self-control will improve Ability to identify and develop effective coping behaviors will improve Ability to maintain clinical measurements within normal limits will improve Compliance with prescribed medications will improve Ability to identify changes in lifestyle to reduce recurrence of condition will improve Ability to verbalize feelings will improve Ability to disclose and discuss suicidal ideas Ability to demonstrate self-control will improve Ability  to identify and develop effective coping behaviors will improve Ability to maintain clinical measurements within normal limits will improve Compliance with prescribed medications will improve  Medication Management: Evaluate patient's response, side effects, and tolerance of medication regimen.  Therapeutic Interventions: 1 to 1 sessions, Unit Group sessions and Medication  administration.  Evaluation of Outcomes: Adequate for Discharge  Physician Treatment Plan for Secondary Diagnosis: Principal Problem:   MDD (major depressive disorder), recurrent episode, severe (HCC)  Long Term Goal(s): Improvement in symptoms so as ready for discharge Improvement in symptoms so as ready for discharge   Short Term Goals: Ability to identify changes in lifestyle to reduce recurrence of condition will improve Ability to verbalize feelings will improve Ability to disclose and discuss suicidal ideas Ability to demonstrate self-control will improve Ability to identify and develop effective coping behaviors will improve Ability to maintain clinical measurements within normal limits will improve Compliance with prescribed medications will improve Ability to identify changes in lifestyle to reduce recurrence of condition will improve Ability to verbalize feelings will improve Ability to disclose and discuss suicidal ideas Ability to demonstrate self-control will improve Ability to identify and develop effective coping behaviors will improve Ability to maintain clinical measurements within normal limits will improve Compliance with prescribed medications will improve     Medication Management: Evaluate patient's response, side effects, and tolerance of medication regimen.  Therapeutic Interventions: 1 to 1 sessions, Unit Group sessions and Medication administration.  Evaluation of Outcomes: Adequate for Discharge   RN Treatment Plan for Primary Diagnosis: MDD (major depressive disorder), recurrent episode, severe (HCC) Long Term Goal(s): Knowledge of disease and therapeutic regimen to maintain health will improve  Short Term Goals: Ability to remain free from injury will improve, Ability to verbalize frustration and anger appropriately will improve, Ability to participate in decision making will improve and Compliance with prescribed medications will improve  Medication  Management: RN will administer medications as ordered by provider, will assess and evaluate patient's response and provide education to patient for prescribed medication. RN will report any adverse and/or side effects to prescribing provider.  Therapeutic Interventions: 1 on 1 counseling sessions, Psychoeducation, Medication administration, Evaluate responses to treatment, Monitor vital signs and CBGs as ordered, Perform/monitor CIWA, COWS, AIMS and Fall Risk screenings as ordered, Perform wound care treatments as ordered.  Evaluation of Outcomes: Adequate for Discharge   LCSW Treatment Plan for Primary Diagnosis: MDD (major depressive disorder), recurrent episode, severe (HCC) Long Term Goal(s): Safe transition to appropriate next level of care at discharge, Engage patient in therapeutic group addressing interpersonal concerns.  Short Term Goals: Engage patient in aftercare planning with referrals and resources, Increase social support, Identify triggers associated with mental health/substance abuse issues and Increase skills for wellness and recovery  Therapeutic Interventions: Assess for all discharge needs, 1 to 1 time with Social worker, Explore available resources and support systems, Assess for adequacy in community support network, Educate family and significant other(s) on suicide prevention, Complete Psychosocial Assessment, Interpersonal group therapy.  Evaluation of Outcomes: Adequate for Discharge   Progress in Treatment: Attending groups: Yes. Participating in groups: Yes. Taking medication as prescribed: Yes. Toleration medication: Yes. Family/Significant other contact made: Yes, individual(s) contacted:  partners mother Patient understands diagnosis: Yes. Discussing patient identified problems/goals with staff: Yes. Medical problems stabilized or resolved: Yes. Denies suicidal/homicidal ideation: Yes. Issues/concerns per patient self-inventory: No.   New problem(s)  identified: No, Describe:  none  New Short Term/Long Term Goal(s): medication stabilization, elimination of SI thoughts, development of  comprehensive mental wellness plan.   Patient Goals: "To focus on the positives"    Discharge Plan or Barriers: Pt is to return to live with girlfriend. Pt is to follow up with [facility] for therapy and medication.   Reason for Continuation of Hospitalization: Depression Medication stabilization  Estimated Length of Stay: Adequate for discharge   Attendees: Patient: Cole Ashley 11/18/2020   Physician: Landry Mellow, MD 11/18/2020   Nursing:  11/18/2020   RN Care Manager: 11/18/2020   Social Worker: Ruthann Cancer, LCSW 11/18/2020   Recreational Therapist:  11/18/2020   Other:  11/18/2020   Other:  11/18/2020   Other: 11/18/2020     Scribe for Treatment Team: Otelia Santee, LCSW 11/18/2020 10:57 AM

## 2020-11-18 NOTE — Discharge Summary (Signed)
Physician Discharge Summary Note  Patient:  Cole Ashley is an 20 y.o., male MRN:  [id number] DOB:  Dec 13, 2000 Patient phone:  [phone] (home)  Patient address:   [address] [place] [address],  Total Time spent with patient: 30 minutes  Date of Admission:  11/17/2020 Date of Discharge: 11/18/2020  Reason for Admission: (From MD's admission note): Patient is a 20 year old male with a reported past psychiatric history significant for attention deficit disorder as well as mood instability who presented to the [facility] emergency department on 11/15/2020 with suicidal ideation. The patient had gotten into an argument with his girlfriend as well as his parents, and admitted to suicidal ideation. The patient reported that he was planning to overdose on medications. The patient stated that he had made mistakes in his relationship. He stated that he been unfaithful to his girlfriend, and she became angry over this. He had previously been treated with psychiatric medicines per [place]. He had been previously prescribed Lamictal as well as Concerta. Review of the PMP database revealed that he had not had these medications at least since January of this year. He currently denies suicidal ideation. He just wants to get back on his medications and feel better. He stated that he and his girlfriend have discussed the situation, and she is willing to let him come back to live with her. He was admitted to the hospital for evaluation and stabilization.  Evaluation on the unit, day of discharge: Patient is seen and evaluated. Patient denies SI/HI/AVH, paranoia and delusions. Patient was admitted for depression and passive SI due to relationship stress with his girlfriend.  Patient slept well last night and his appetite is good.  He attended group therapy and worked on Pharmacologist. He has a follow up appointment on 4/26 with Dr Jannifer Franklin at [facility] for medication  management. He has a therapy appointment on 5/3 at [facility]. Patient is stable for discharge home today.   Principal Problem: MDD (major depressive disorder), recurrent episode, severe (HCC) Discharge Diagnoses: Active Problems:   MDD (major depressive disorder), recurrent episode, severe (HCC)   Past Psychiatric History: See H&P  Past Medical History:  Past Medical History:  Diagnosis Date  . ADHD (attention deficit hyperactivity disorder)   . Asthma   . Innocent heart murmur   . Insomnia 09/04/2003  . Oppositional defiant disorder   . Seasonal allergies   . Unspecified episodic mood disorder   . Wears glasses     Past Surgical History:  Procedure Laterality Date  . CIRCUMCISION  1999-08-30  . tubes in ears     in the past   Family History:  Family History  Problem Relation Age of Onset  . Bipolar disorder Mother   . Migraines Mother   . Anxiety disorder Mother   . ADD / ADHD Brother   . Seizures Brother   . Migraines Brother   . Anxiety disorder Brother   . Asthma Brother   . ADD / ADHD Brother   . OCD Brother   . Migraines Brother   . Insomnia Brother   . ADD / ADHD Sister   . Alcohol abuse Father   . Anxiety disorder Maternal Grandfather   . Alcohol abuse Paternal Grandfather   . Anxiety disorder Paternal Grandmother        PGGM  . Dementia Neg Hx   . Depression Neg Hx   . Drug abuse Neg Hx   . Schizophrenia Neg Hx   . Paranoid  behavior Neg Hx   . Sexual abuse Neg Hx   . Physical abuse Neg Hx    Family Psychiatric  History: See H&P Social History:  Social History   Substance and Sexual Activity  Alcohol Use No     Social History   Substance and Sexual Activity  Drug Use No    Social History   Socioeconomic History  . Marital status: Single    Spouse name: Not on file  . Number of children: Not on file  . Years of education: Not on file  . Highest education level: Not on file  Occupational History  . Not on file  Tobacco Use  . Smoking status:  Never Smoker  . Smokeless tobacco: Never Used  Vaping Use  . Vaping Use: Never used  Substance and Sexual Activity  . Alcohol use: No  . Drug use: No  . Sexual activity: Yes    Birth control/protection: Condom  Other Topics Concern  . Not on file  Social History Narrative  . Not on file   Social Determinants of Health   Financial Resource Strain: Not on file  Food Insecurity: Not on file  Transportation Needs: Not on file  Physical Activity: Not on file  Stress: Not on file  Social Connections: Not on file    Hospital Course:  After the above admission evaluation, Cole Ashley's  presenting symptoms were noted. He was recommended for mood stabilization treatments. The medication regimen targeting those presenting symptoms were discussed with him & initiated with his consent. His UDS and BSL on arrival to the ED were negative. He stated he has been using CBD gummies.  He was however started on Lexapro 10 mg PO daily and Lamictal 25 mg PO daily for depression and mood instability. He was previously on Lamictal 100 mg PO daily but had stopped taking it some time ago. He wanted to be restarted on this medication and his outpatient provider will follow him and increase his dose as necessary.  He was stabilized & discharged on the medications as listed on his discharge medication list below. Besides the mood stabilization treatments, Cole Ashley was also enrolled & participated in the group counseling sessions being offered & held on this unit. He learned coping skills. He presented no other significant pre-existing medical issues that required treatment. He tolerated his treatment regimen without any adverse effects or reactions reported.   During the course of his hospitalization, the 15-minute checks were adequate to ensure patient's safety. Cole Ashley did not display any dangerous, violent or suicidal behavior on the unit.  He interacted with patients & staff appropriately, participated appropriately in the  group sessions/therapies. His medications were addressed & adjusted to meet his needs. He was recommended for outpatient follow-up care & medication management upon discharge to assure continuity of care & mood stability.  At the time of discharge patient is not reporting any acute suicidal/homicidal ideations. He feels more confident about his/her self-care & in managing his mental health. He currently denies any new issues or concerns. Education and supportive counseling provided throughout his hospital stay & upon discharge.   Today upon his discharge evaluation with the attending psychiatrist, Cole Ashley shares he is doing well. He denies any other specific concerns. He is sleeping well. His appetite is good. He denies other physical complaints. He denies AH/VH, delusional thoughts or paranoia. He does not appear to be responding to any internal stimuli. He feels that his medications have been helpful & is in agreement to continue his  current treatment regimen as recommended. He was able to engage in safety planning including plan to return to [facility] or contact emergency services if he feels unable to maintain his own safety or the safety of others. Pt had no further questions, comments, or concerns. He left [facility] with all personal belongings in no apparent distress. Transportation per private vehicle.    Physical Findings: AIMS: Facial and Oral Movements Muscles of Facial Expression: None, normal Lips and Perioral Area: None, normal Jaw: None, normal Tongue: None, normal,Extremity Movements Upper (arms, wrists, hands, fingers): None, normal Lower (legs, knees, ankles, toes): None, normal, Trunk Movements Neck, shoulders, hips: None, normal, Overall Severity Severity of abnormal movements (highest score from questions above): None, normal Incapacitation due to abnormal movements: None, normal Patient's awareness of abnormal movements (rate only patient's report): No Awareness, Dental Status Current  problems with teeth and/or dentures?: No Does patient usually wear dentures?: No  CIWA:  CIWA-Ar Total: 0 COWS:  COWS Total Score: 0  Musculoskeletal: Strength & Muscle Tone: within normal limits Gait & Station: normal Patient leans: N/A  Psychiatric Specialty Exam:  Presentation  General Appearance: Appropriate for Environment; Casual  Eye Contact:Good  Speech:Clear and Coherent; Normal Rate  Speech Volume:Normal  Handedness:Right  Mood and Affect  Mood:Euthymic  Affect:Congruent; Appropriate  Thought Process  Thought Processes:Coherent; Linear; Goal Directed  Descriptions of Associations:Intact  Orientation:Full (Time, Place and Person)  Thought Content:Logical  History of Schizophrenia/Schizoaffective disorder:No  Duration of Psychotic Symptoms:No data recorded Hallucinations:Hallucinations: None  Ideas of Reference:None  Suicidal Thoughts:Suicidal Thoughts: No  Homicidal Thoughts:Homicidal Thoughts: No  Sensorium  Memory:Immediate Good; Recent Good; Remote Good  Judgment:Good  Insight:Good  Executive Functions  Concentration:Good  Attention Span:Good  Recall:Good  Fund of Knowledge:Good  Language:Good  Psychomotor Activity  Psychomotor Activity:Psychomotor Activity: Normal  Assets  Assets:Communication Skills; Desire for Improvement; Physical Health; Resilience; Social Support; Housing  Sleep  Sleep:Sleep: Good Number of Hours of Sleep: 6.75  Physical Exam: Physical Exam Constitutional:      Appearance: Normal appearance.  HENT:     Head: Normocephalic.  Pulmonary:     Effort: Pulmonary effort is normal.  Musculoskeletal:        General: Normal range of motion.     Cervical back: Normal range of motion.  Neurological:     Mental Status: He is alert and oriented to person, place, and time.    ROS Blood pressure 117/73, pulse (!) 101, temperature 97.9 F (36.6 C), temperature source Oral, resp. rate 18, height 5\' 7"  (1.702  m), weight 50.8 kg, SpO2 98 %. Body mass index is 17.54 kg/m.   Have you used any form of tobacco in the last 30 days? (Cigarettes, Smokeless Tobacco, Cigars, and/or Pipes): No  Has this patient used any form of tobacco in the last 30 days? (Cigarettes, Smokeless Tobacco, Cigars, and/or Pipes) Yes, N/A  Blood Alcohol level:  Lab Results  Component Value Date   ETH <10 11/15/2020    Metabolic Disorder Labs:  No results found for: HGBA1C, MPG No results found for: PROLACTIN No results found for: CHOL, TRIG, HDL, CHOLHDL, VLDL, LDLCALC  See Psychiatric Specialty Exam and Suicide Risk Assessment completed by Attending Physician prior to discharge.  Discharge destination:  Home  Is patient on multiple antipsychotic therapies at discharge:  No   Has Patient had three or more failed trials of antipsychotic monotherapy by history:  No  Recommended Plan for Multiple Antipsychotic Therapies: NA  Discharge Instructions    Diet - low  sodium heart healthy   Complete by: As directed    Increase activity slowly   Complete by: As directed      Allergies as of 11/18/2020      Reactions   Amoxicillin Itching, Swelling, Rash   Face swelling      Medication List    STOP taking these medications   ARIPiprazole 10 MG tablet Commonly known as: ABILIFY   atomoxetine 40 MG capsule Commonly known as: STRATTERA   cloNIDine 0.1 MG tablet Commonly known as: CATAPRES   melatonin 3 MG Tabs tablet     TAKE these medications     Indication  beclomethasone 80 MCG/ACT inhaler Commonly known as: QVAR Inhale 2 puffs into the lungs 2 (two) times daily.  Indication: Asthma   escitalopram 10 MG tablet Commonly known as: LEXAPRO Take 1 tablet (10 mg total) by mouth daily. Start taking on: November 19, 2020  Indication: Generalized Anxiety Disorder, Major Depressive Disorder   lamoTRIgine 25 MG tablet Commonly known as: LAMICTAL Take 1 tablet (25 mg total) by mouth daily. Start taking on:  November 19, 2020 What changed:   how much to take  when to take this  Indication: Mood instability   mometasone 220 MCG/INH inhaler Commonly known as: ASMANEX Inhale 2 puffs into the lungs daily.  Indication: Asthma   Multi-Vitamin tablet Take 1 tablet by mouth daily.  Indication: Nutritional Support   ProAir HFA 108 (90 Base) MCG/ACT inhaler Generic drug: albuterol Inhale 2 puffs into the lungs every 4 (four) hours as needed. For coughing  Indication: Asthma       Follow-up Information    [facility].   Why: You have an appointment for medication management on 4/26 at 9am with Dr. Sunnie NielsenA. You have an appointment for therapy on 5/3 at 9am. These appointments will be held virtually. Contact information: [address] [place] [address] [phone]               Follow-up recommendations:  Activity:  as tolerated Diet:  Heart healthy  Comments:  Prescriptions given at discharge.  Patient agreeable to plan.  Given opportunity to ask questions.  Appears to feel comfortable with discharge denies any current suicidal or homicidal thoughts.   Patient is instructed prior to discharge to: Take all medications as prescribed by his mental healthcare provider. Report any adverse effects and or reactions from the medicines to his outpatient provider promptly. Patient has been instructed & cautioned: To not engage in alcohol and or illegal drug use while on prescription medicines. In the event of worsening symptoms, patient is instructed to call the crisis hotline, 911 and or go to the nearest ED for appropriate evaluation and treatment of symptoms. To follow-up with his primary care provider for your other medical issues, concerns and or health care needs.   Signed: Laveda AbbeLaurie Britton Burnie Therien, NP 11/18/2020, 9:24 AM

## 2020-11-18 NOTE — Progress Notes (Signed)
  [facility] Adult Case Management Discharge Plan :  Will you be returning to the same living situation after discharge:  Yes,  girlfriend's home At discharge, do you have transportation home?: Yes,  girlfriend Do you have the ability to pay for your medications: Yes,  has private insutance  Release of information consent forms completed and in the chart;  Patient's signature needed at discharge.  Patient to Follow up at:  Follow-up Information    [facility] Follow [facility].   Why: You have an appointment for medication management on 4/26 at 9am with Dr. Sunnie Nielsen have an appointment for therapy on 5/3 at 9am. These appointments will be held virtually. Contact information: [address] [place] [address] [phone]               Next level of care provider has access to [facility] Link:no  Safety Planning and Suicide Prevention discussed: Yes,  w/ pt  Have you used any form of tobacco in the last 30 days? (Cigarettes, Smokeless Tobacco, Cigars, and/or Pipes): No  Has patient been referred to the Quitline?: N/A patient is not a smoker  Patient has been referred for addiction treatment: N/A  Felizardo Hoffmann, Theresia Majors 11/18/2020, 9:40 AM

## 2020-11-18 NOTE — Progress Notes (Signed)
   11/17/20 2045  Psych Admission Type (Psych Patients Only)  Admission Status Voluntary  Psychosocial Assessment  Patient Complaints None  Eye Contact Fair  Facial Expression Anxious  Affect Fearful  Speech Soft;Slow  Interaction Guarded;Cautious  Motor Activity Slow  Appearance/Hygiene In scrubs  Behavior Characteristics Cooperative;Calm  Mood Pleasant  Aggressive Behavior  Effect No apparent injury  Thought Process  Coherency WDL  Content WDL  Delusions None reported or observed  Perception WDL  Hallucination None reported or observed  Judgment WDL  Confusion WDL  Danger to Self  Current suicidal ideation? Denies  Self-Injurious Behavior Some self-injurious ideation observed or expressed.  No lethal plan expressed   Agreement Not to Harm Self Yes  Description of Agreement verbal contract for safety  Danger to Others  Danger to Others None reported or observed

## 2020-11-18 NOTE — [facility] Group Notes (Signed)
LCSW Group Therapy Note  11/18/2020   Type of Therapy and Topic:  Group Therapy - Healthy vs Unhealthy Coping Skills  Participation Level:  Did Not Attend  Description of Group The focus of this group was to determine what unhealthy coping techniques typically are used by group members and what healthy coping techniques would be helpful in coping with various problems. Patients were guided in becoming aware of the differences between healthy and unhealthy coping techniques. Patients were asked to identify 2-3 healthy coping skills they would like to learn to use more effectively.  Therapeutic Goals 1. Patients learned that coping is what human beings do all day long to deal with various situations in their lives 2. Patients defined and discussed healthy vs unhealthy coping techniques 3. Patients identified their preferred coping techniques and identified whether these were healthy or unhealthy 4. Patients determined 2-3 healthy coping skills they would like to become more familiar with and use more often. 5. Patients provided support and ideas to each other   Summary of Patient Progress:  Pt did not attend group due to discharging during group time.    Therapeutic Modalities Cognitive Behavioral Therapy Motivational Interviewing  Jaqwon Manfred M Katilyn Miltenberger, LCSWA 11/18/2020  1:57 PM    

## 2020-11-18 NOTE — [facility] Suicide Risk Assessment (Signed)
[facility] INPATIENT:  Family/Significant Other Suicide Prevention Education  Suicide Prevention Education:  Contact Attempts: Burgess Amor (girlfriend's mother) [phone], (name of family member/significant other) has been identified by the patient as the family member/significant other with whom the patient will be residing, and identified as the person(s) who will aid the patient in the event of a mental health crisis.  With written consent from the patient, two attempts were made to provide suicide prevention education, prior to and/or following the patient's discharge.  We were unsuccessful in providing suicide prevention education.  A suicide education pamphlet was given to the patient to share with family/significant other.  Date and time of first attempt:11/18/20  9:37am Date and time of second attempt: CSW completed SPE with pt due to pt discharging.   Felizardo Hoffmann 11/18/2020, 9:39 AM

## 2020-11-18 NOTE — Progress Notes (Signed)
RN met with pt and reviewed pt's discharge instructions.  Pt verbalized understanding of discharge instructions and pt did not have any questions. RN reviewed and provided pt with a copy of SRA, AVS and Transition Record.  RN returned pt's belongings to pt.  Pt denied SI/HI/AVH and voiced no concerns.  Pt was appreciative of the care pt received at [facility].  Patient discharged to the lobby without incident. 

## 2020-11-18 NOTE — [facility] Group Notes (Signed)
The focus of this group is to help patients establish daily goals to achieve during treatment and discuss how the patient can incorporate goal setting into their daily lives to aide in recovery.  Pt attended group, goal is to keep positive thoughts

## 2020-11-20 NOTE — [facility] Group Notes (Signed)
ADULT GRIEF GROUP NOTE:   Spiritual care group on grief and loss facilitated by Kathleen Argue, [facility]   Group Goal:   Support / Education around grief and loss   Members engage in facilitated group support and psycho-social education.   Group Description:   Following introductions and group rules, group members engaged in facilitated group dialog and support around topic of loss, with particular support around experiences of loss in their lives. Group Identified types of loss (relationships / self / things) and identified patterns, circumstances, and changes that precipitate losses. Reflected on thoughts / feelings around loss, normalized grief responses, and recognized variety in grief experience. Group noted Worden's four tasks of grief in discussion.   Group drew on Adlerian / Rogerian, narrative, MI,   Patient Progress:  Cole Ashley attended and participated in group. He spoke very little, but showed attentive listening.    Chaplain Dyanne Carrel, [facility] Pager, [phone] 10:11 AM

## 2021-02-08 IMAGING — DX DG HAND COMPLETE 3+V*L*
3 series · 3 of 3 positions shown · non-contrast
Comparison: None.

CLINICAL DATA: Pain and swelling

EXAM:
LEFT HAND - COMPLETE 3+ VIEW

[hand pa]
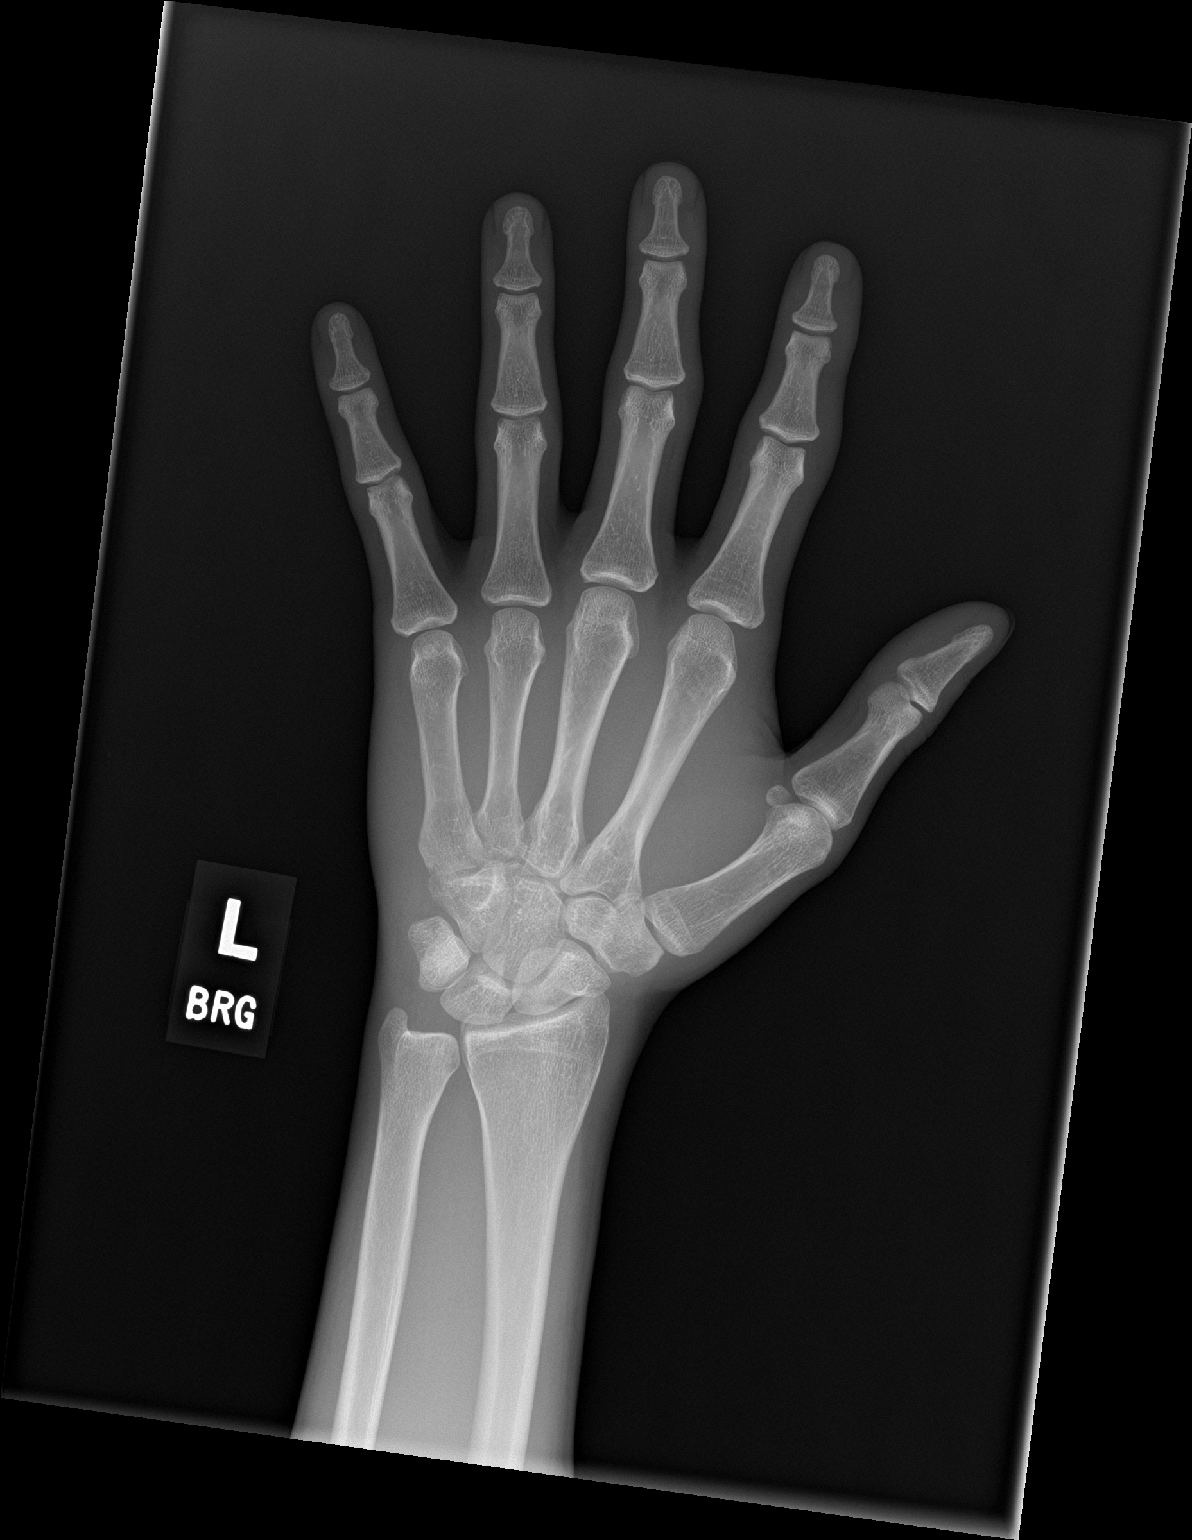

[hand obl]
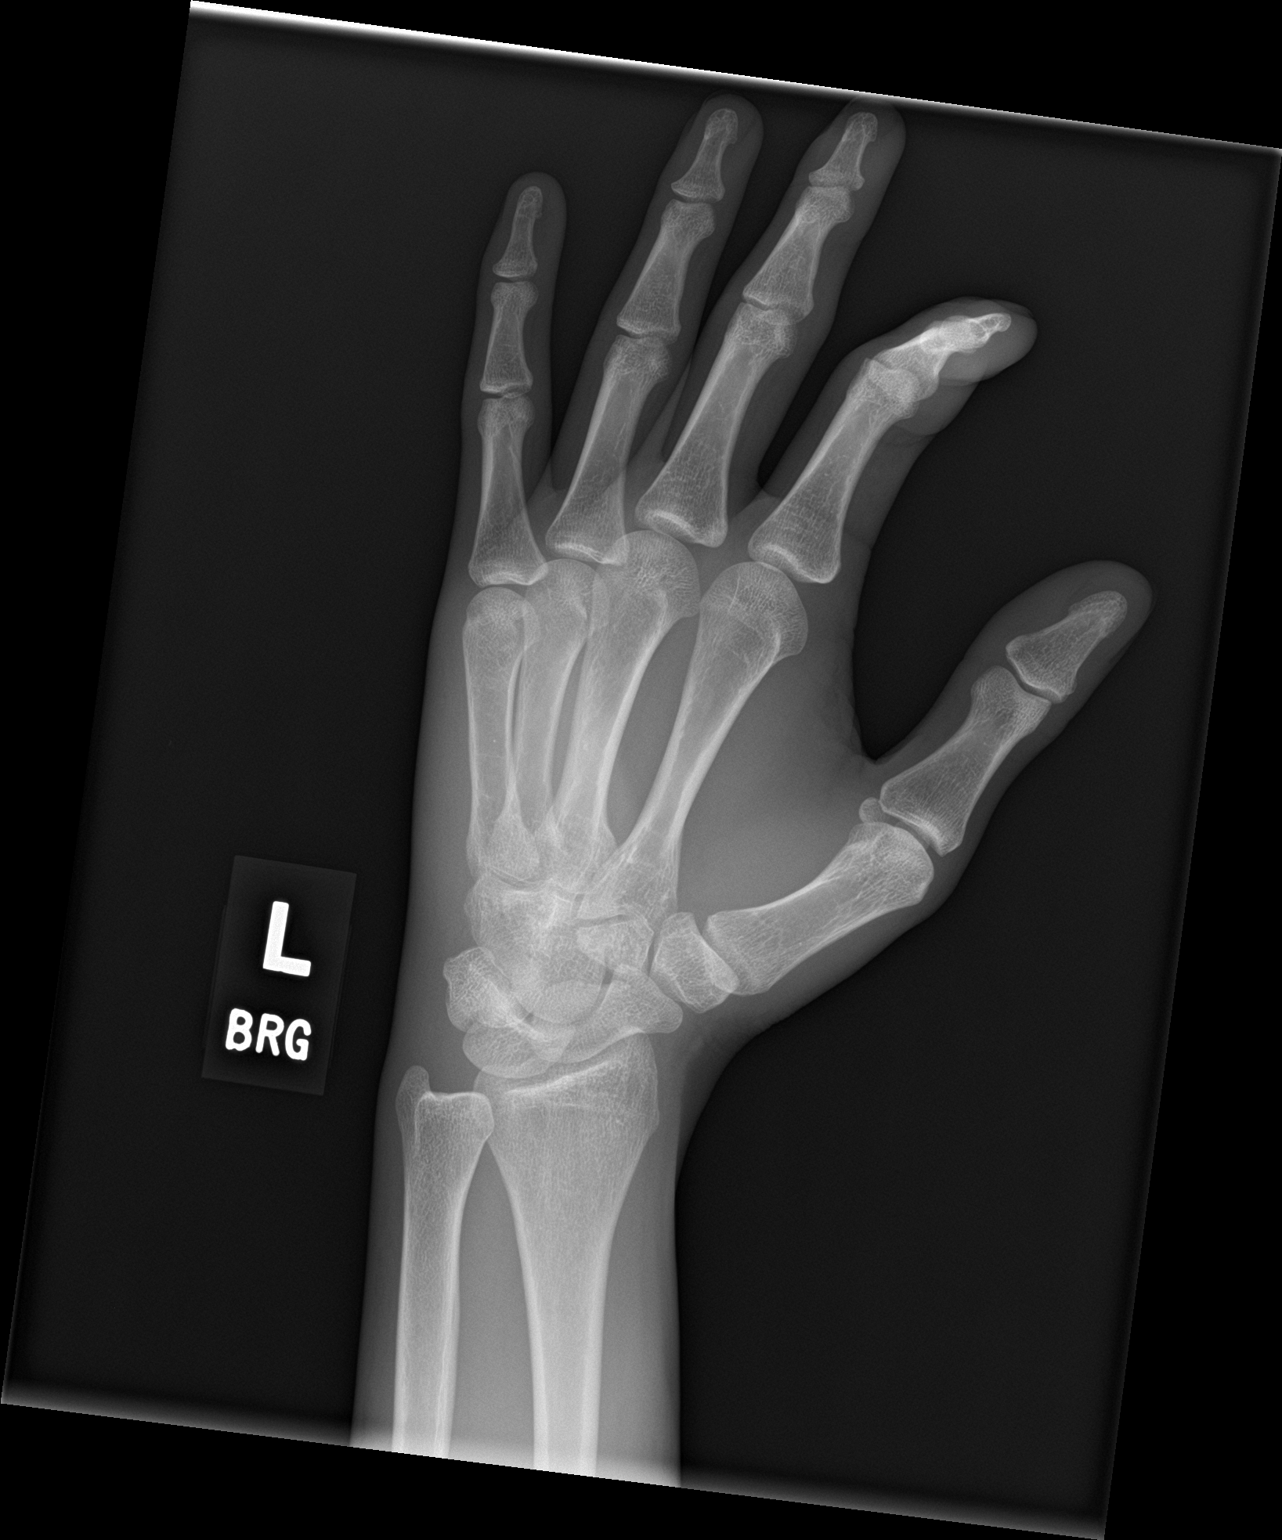

[hand lat]
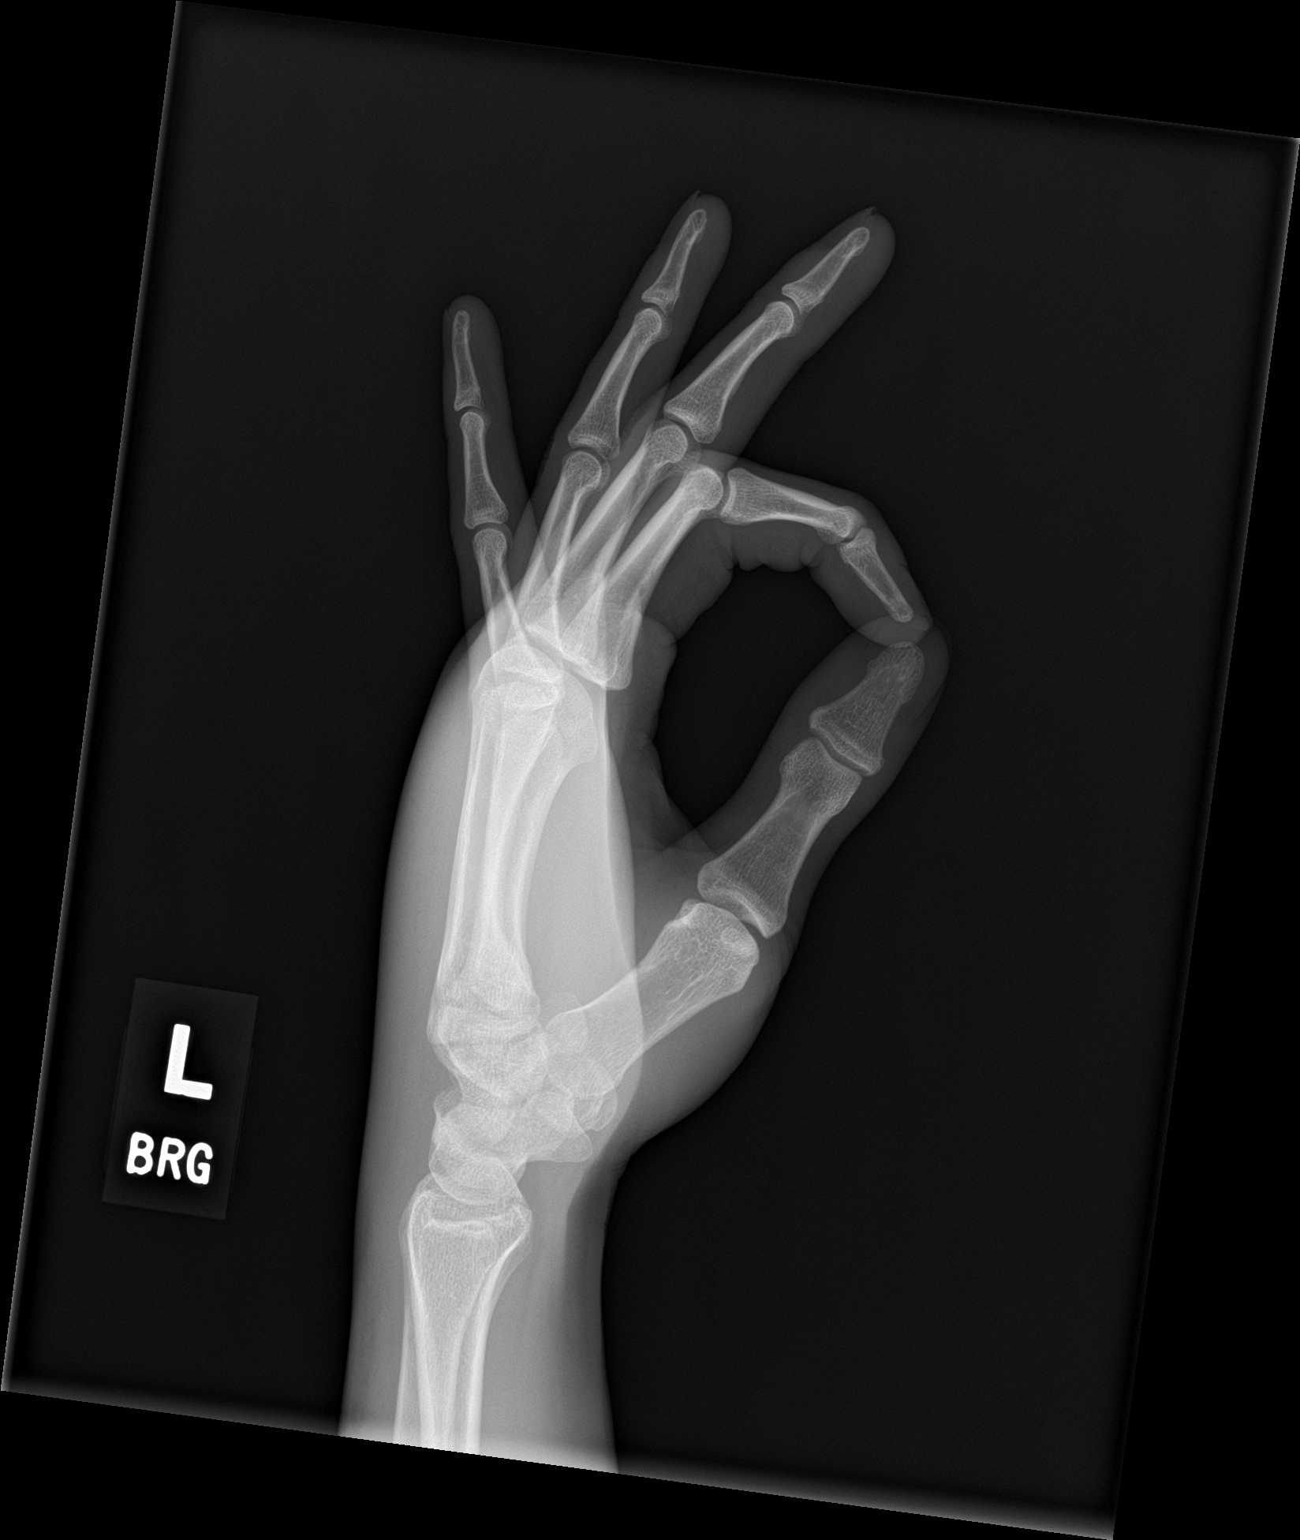

[3 of 3 positions shown; findings below may reference images not displayed]

FINDINGS: And three view radiograph left hand demonstrates normal alignment.
No acute fracture or dislocation. Joint spaces are preserved. There
is moderate soft tissue swelling along the dorsum of the left hand.
IMPRESSION: Soft tissue swelling without acute fracture or dislocation.
